# Patient Record
Sex: Female | Born: 1990 | Race: White | Hispanic: No | Marital: Single | Smoking: Never smoker
Health system: Southern US, Community
[De-identification: ages and names within clinical notes are randomized; demographics above are authoritative.]

## PROBLEM LIST (undated history)

## (undated) DIAGNOSIS — F32A Depression, unspecified: Secondary | ICD-10-CM

## (undated) DIAGNOSIS — R87629 Unspecified abnormal cytological findings in specimens from vagina: Secondary | ICD-10-CM

## (undated) DIAGNOSIS — F329 Major depressive disorder, single episode, unspecified: Secondary | ICD-10-CM

## (undated) DIAGNOSIS — F419 Anxiety disorder, unspecified: Secondary | ICD-10-CM

## (undated) DIAGNOSIS — J309 Allergic rhinitis, unspecified: Secondary | ICD-10-CM

## (undated) DIAGNOSIS — D649 Anemia, unspecified: Secondary | ICD-10-CM

## (undated) DIAGNOSIS — K589 Irritable bowel syndrome without diarrhea: Secondary | ICD-10-CM

## (undated) DIAGNOSIS — D242 Benign neoplasm of left breast: Secondary | ICD-10-CM

## (undated) DIAGNOSIS — A64 Unspecified sexually transmitted disease: Secondary | ICD-10-CM

## (undated) HISTORY — DX: Major depressive disorder, single episode, unspecified: F32.9

## (undated) HISTORY — DX: Anemia, unspecified: D64.9

## (undated) HISTORY — DX: Unspecified sexually transmitted disease: A64

## (undated) HISTORY — DX: Allergic rhinitis, unspecified: J30.9

## (undated) HISTORY — DX: Irritable bowel syndrome, unspecified: K58.9

## (undated) HISTORY — DX: Anxiety disorder, unspecified: F41.9

## (undated) HISTORY — DX: Depression, unspecified: F32.A

## (undated) HISTORY — DX: Benign neoplasm of left breast: D24.2

## (undated) HISTORY — DX: Unspecified abnormal cytological findings in specimens from vagina: R87.629

---
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
## 2011-03-02 LAB — CBC
HCT: 30.8 % — ABNORMAL LOW (ref 36.0–46.0)
Hemoglobin: 10.4 g/dL — ABNORMAL LOW (ref 12.0–15.0)
MCH: 26.4 pg (ref 26.0–34.0)
MCHC: 33.7 g/dL (ref 30.0–36.0)
MCV: 78.6 fL (ref 78.0–100.0)
Platelets: 318 10*3/uL (ref 150–400)
RBC: 3.92 MIL/uL (ref 3.87–5.11)
RDW: 15.4 % (ref 11.5–15.5)
WBC: 5.3 10*3/uL (ref 4.0–10.5)

## 2011-03-02 LAB — POCT PREGNANCY, URINE: Preg Test, Ur: NEGATIVE

## 2011-03-02 LAB — VON WILLEBRAND ANTIGEN: Von Willebrand Antigen, Plasma: 92 % (ref 50–217)

## 2011-03-02 LAB — PROTIME-INR: Prothrombin Time: 13.4 seconds (ref 11.6–15.2)

## 2011-08-19 DIAGNOSIS — A64 Unspecified sexually transmitted disease: Secondary | ICD-10-CM

## 2011-08-19 HISTORY — DX: Unspecified sexually transmitted disease: A64

## 2011-09-18 ENCOUNTER — Emergency Department (HOSPITAL_COMMUNITY)
Admission: EM | Admit: 2011-09-18 | Discharge: 2011-09-18 | Disposition: A | Discharge status: Home / Self Care | Payer: BC Managed Care – PPO | Attending: Emergency Medicine | Admitting: Emergency Medicine

## 2011-09-18 ENCOUNTER — Emergency Department (HOSPITAL_COMMUNITY): Payer: BC Managed Care – PPO

## 2011-09-18 DIAGNOSIS — N739 Female pelvic inflammatory disease, unspecified: Secondary | ICD-10-CM | POA: Insufficient documentation

## 2011-09-18 DIAGNOSIS — R109 Unspecified abdominal pain: Secondary | ICD-10-CM | POA: Insufficient documentation

## 2011-09-18 DIAGNOSIS — F411 Generalized anxiety disorder: Secondary | ICD-10-CM | POA: Insufficient documentation

## 2011-09-18 LAB — URINE MICROSCOPIC-ADD ON

## 2011-09-18 LAB — WET PREP, GENITAL: Yeast Wet Prep HPF POC: NONE SEEN

## 2011-09-18 LAB — URINALYSIS, ROUTINE W REFLEX MICROSCOPIC
Glucose, UA: NEGATIVE mg/dL
Specific Gravity, Urine: 1.017 (ref 1.005–1.030)
pH: 7 (ref 5.0–8.0)

## 2011-09-18 LAB — CBC
Hemoglobin: 12.8 g/dL (ref 12.0–15.0)
MCH: 26.8 pg (ref 26.0–34.0)
RBC: 4.77 MIL/uL (ref 3.87–5.11)
WBC: 10 10*3/uL (ref 4.0–10.5)

## 2011-09-18 LAB — DIFFERENTIAL
Basophils Absolute: 0 10*3/uL (ref 0.0–0.1)
Basophils Relative: 0 % (ref 0–1)
Monocytes Relative: 9 % (ref 3–12)
Neutro Abs: 7.3 10*3/uL (ref 1.7–7.7)
Neutrophils Relative %: 73 % (ref 43–77)

## 2011-09-18 LAB — COMPREHENSIVE METABOLIC PANEL
Albumin: 3.9 g/dL (ref 3.5–5.2)
BUN: 9 mg/dL (ref 6–23)
Creatinine, Ser: 0.62 mg/dL (ref 0.50–1.10)
GFR calc Af Amer: >90 mL/min (ref >90)
Glucose, Bld: 84 mg/dL (ref 70–99)
Total Protein: 8.3 g/dL (ref 6.0–8.3)

## 2011-09-18 LAB — LIPASE, BLOOD: Lipase: 15 U/L (ref 11–59)

## 2011-09-18 MED ORDER — IOHEXOL 300 MG/ML  SOLN
100.0000 mL | Freq: Once | INTRAMUSCULAR | Status: AC | PRN
  Administered 2011-09-18: 100 mL via INTRAVENOUS

## 2011-09-19 LAB — GC/CHLAMYDIA PROBE AMP, GENITAL
Chlamydia, DNA Probe: POSITIVE — AB
GC Probe Amp, Genital: NEGATIVE

## 2011-09-19 LAB — URINE CULTURE: Colony Count: 3000

## 2011-10-27 ENCOUNTER — Other Ambulatory Visit: Payer: Self-pay | Admitting: Obstetrics and Gynecology

## 2011-10-27 DIAGNOSIS — N63 Unspecified lump in unspecified breast: Secondary | ICD-10-CM

## 2011-10-30 ENCOUNTER — Other Ambulatory Visit: Payer: BC Managed Care – PPO

## 2011-11-03 ENCOUNTER — Ambulatory Visit
Admission: RE | Admit: 2011-11-03 | Discharge: 2011-11-03 | Disposition: A | Discharge status: Home / Self Care | Payer: BC Managed Care – PPO | Source: Ambulatory Visit | Attending: Obstetrics and Gynecology | Admitting: Obstetrics and Gynecology

## 2011-11-03 DIAGNOSIS — N63 Unspecified lump in unspecified breast: Secondary | ICD-10-CM

## 2012-12-06 IMAGING — US US ABDOMEN COMPLETE
1 series · 14 of 25 positions shown · non-contrast
Comparison: None.

CLINICAL DATA: Right upper quadrant pain.

COMPLETE ABDOMINAL ULTRASOUND

[Series 1: us abdomen complete · 0.30mm/px · 14 of 72 slices shown]
[im 1/72]
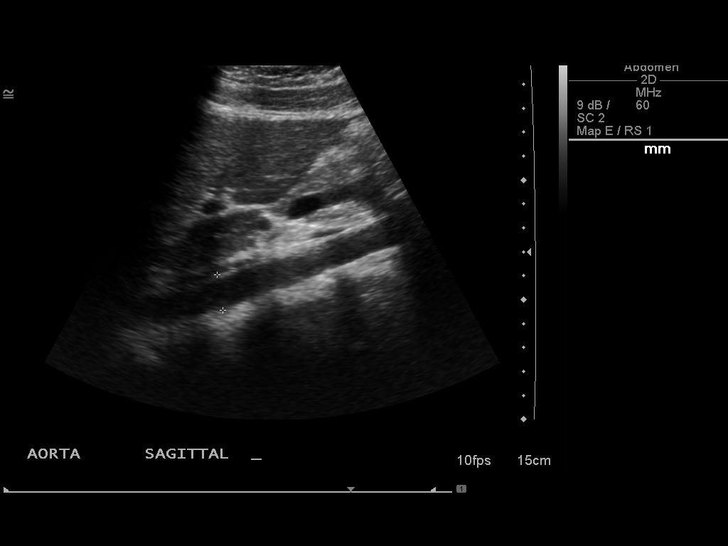
[im 6/72]
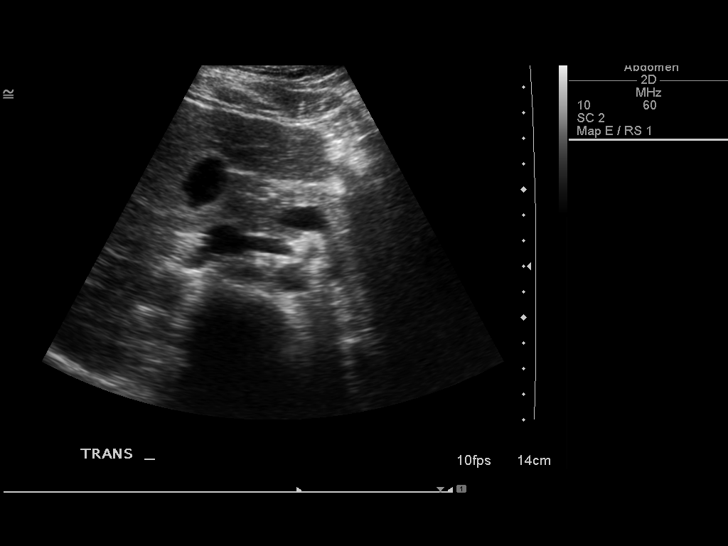
[im 12/72]
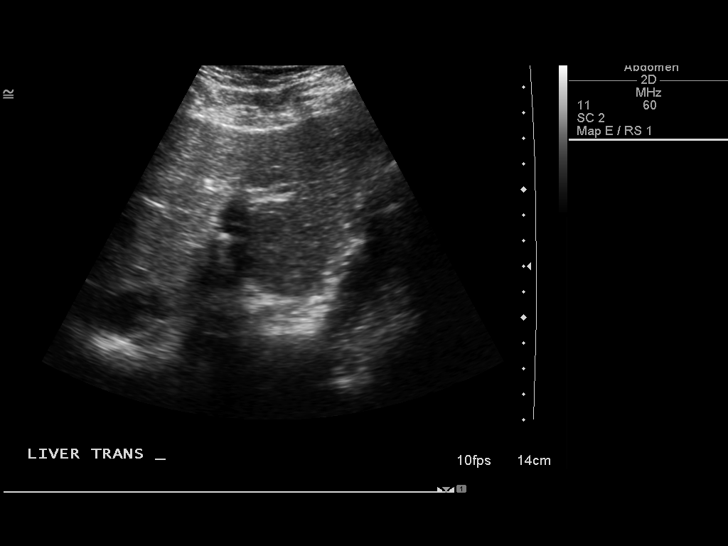
[im 18/72]
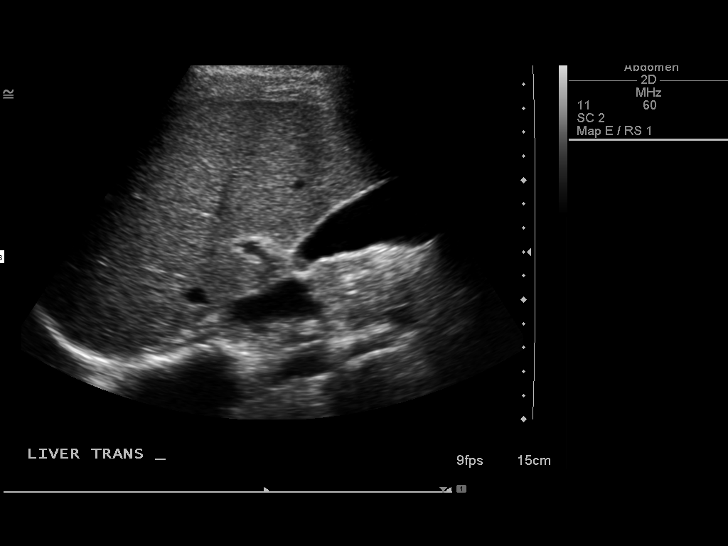
[im 24/72]
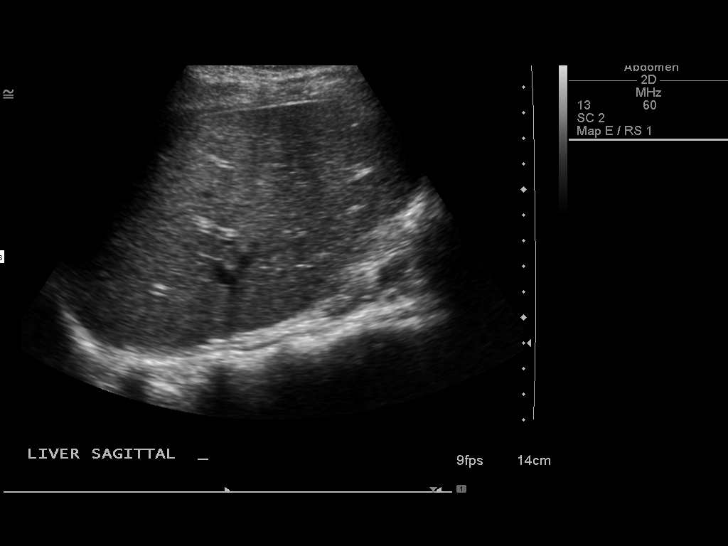
[im 27/72]
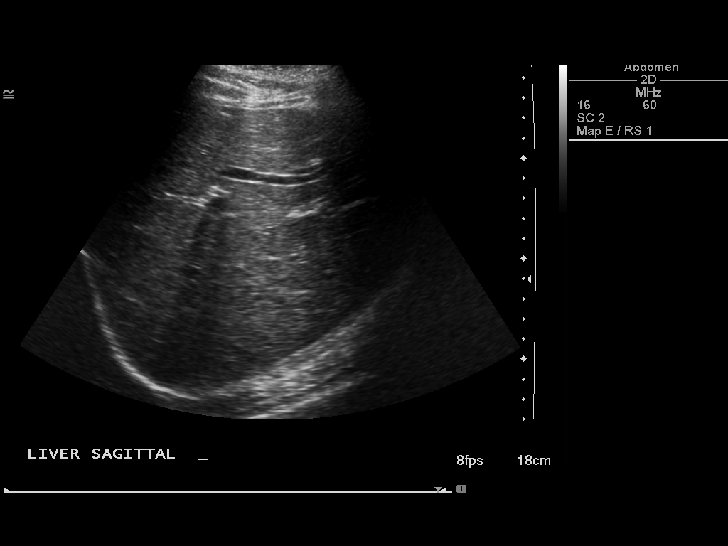
[im 33/72]
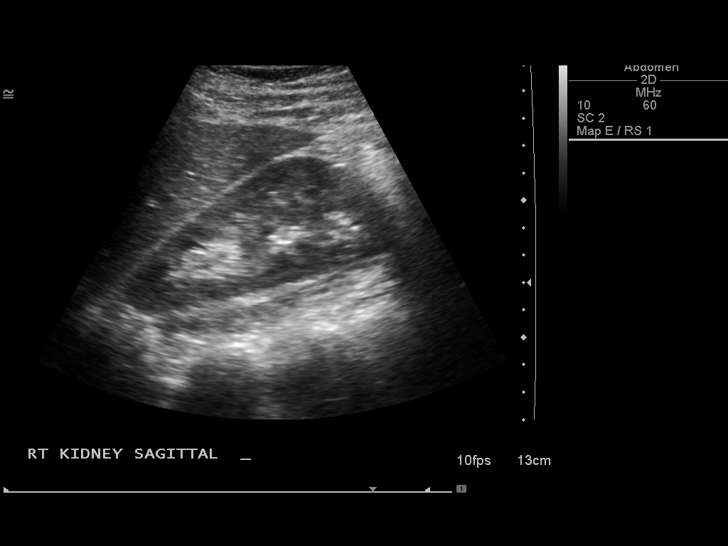
[im 39/72]
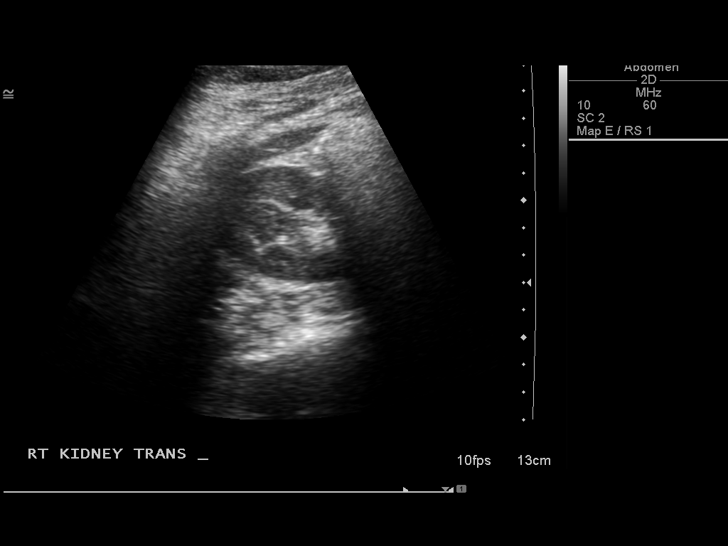
[im 45/72]
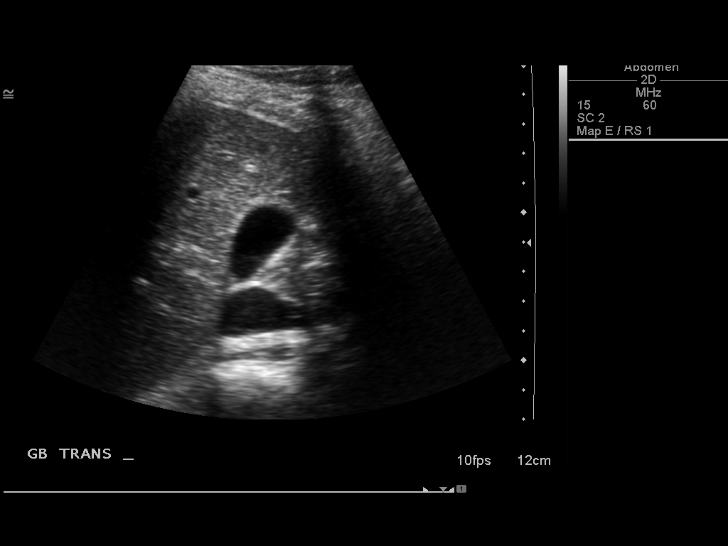
[im 48/72]
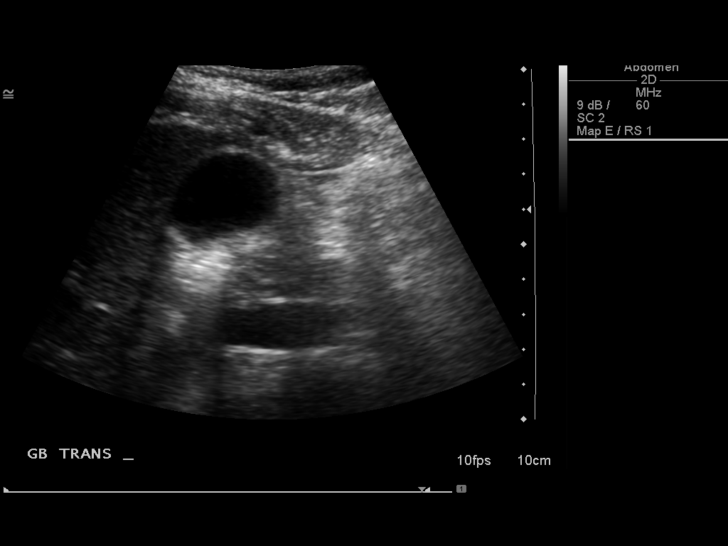
[im 54/72]
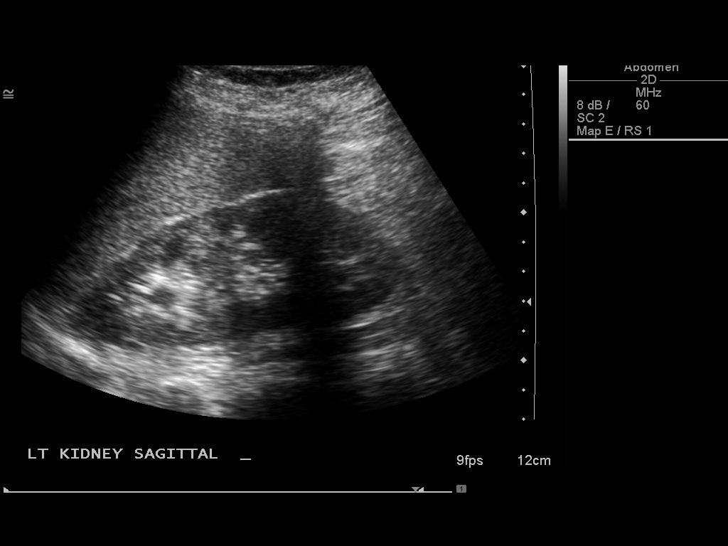
[im 60/72]
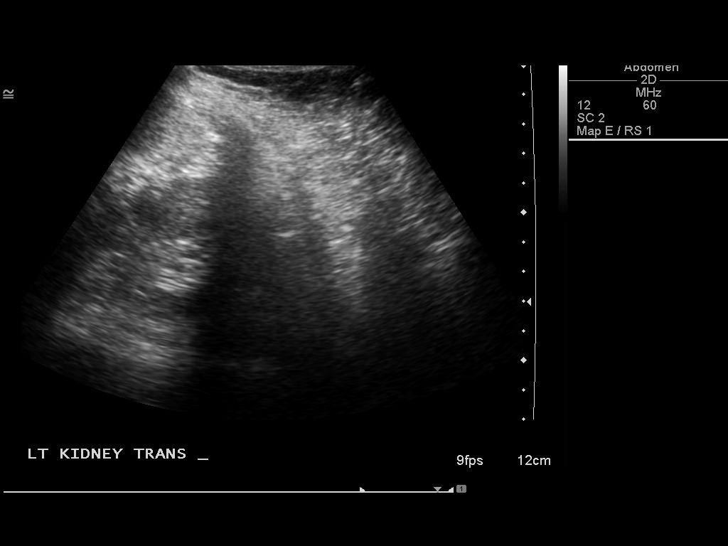
[im 66/72]
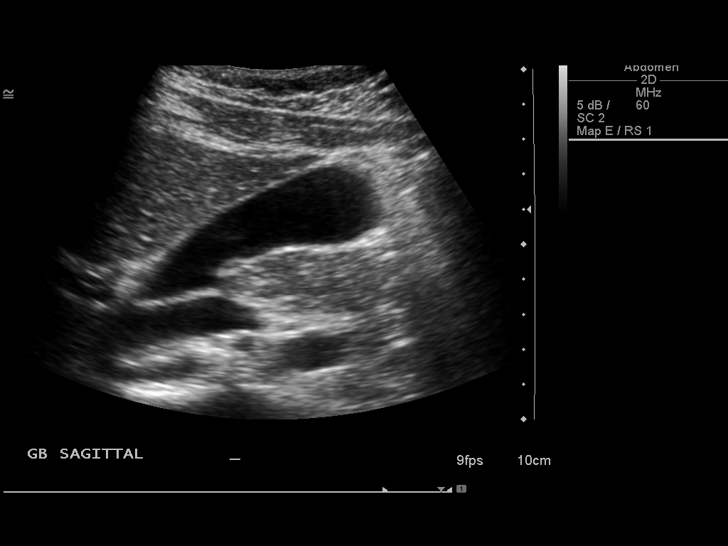
[im 72/72]
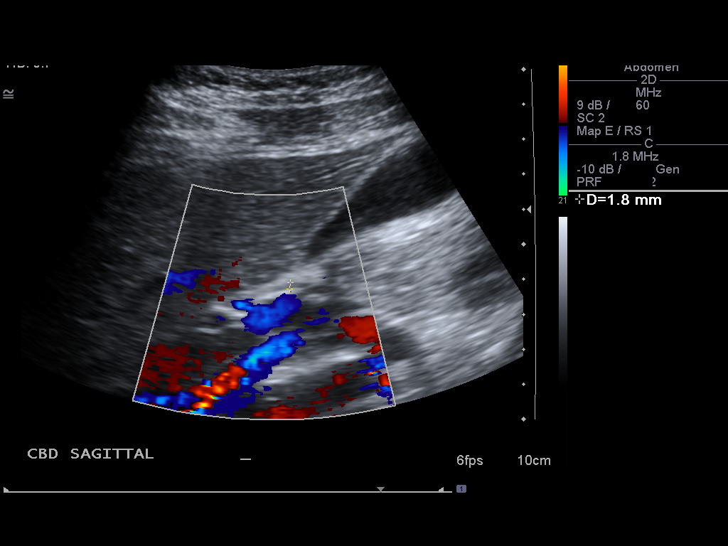

[14 of 25 positions shown; findings below may reference images not displayed]

FINDINGS: Gallbladder:  No gallstones, gallbladder wall thickening, or
pericholecystic fluid.

Common bile duct:   Within normal limits in caliber.

Liver:  No focal lesion identified.  Within normal limits in
parenchymal echogenicity.

IVC:  Appears normal.

Pancreas:  No focal abnormality seen.

Spleen:  Within normal limits in size and echotexture.

Right Kidney:   Normal in size and parenchymal echogenicity.  No
evidence of mass or hydronephrosis.

Left Kidney:  Normal in size and parenchymal echogenicity.  No
evidence of mass or hydronephrosis.

Abdominal aorta:  No aneurysm identified.
IMPRESSION: Negative abdominal ultrasound.

## 2013-03-19 ENCOUNTER — Encounter: Payer: Self-pay | Admitting: Obstetrics and Gynecology

## 2013-08-06 ENCOUNTER — Ambulatory Visit (INDEPENDENT_AMBULATORY_CARE_PROVIDER_SITE_OTHER): Payer: BC Managed Care – PPO | Admitting: Family Medicine

## 2013-08-06 VITALS — BP 114/68 | HR 72 | Temp 98.6°F | Resp 16 | Ht 68.0 in | Wt 143.0 lb

## 2013-08-06 DIAGNOSIS — Z0000 Encounter for general adult medical examination without abnormal findings: Secondary | ICD-10-CM

## 2013-08-06 DIAGNOSIS — Z23 Encounter for immunization: Secondary | ICD-10-CM

## 2013-08-06 NOTE — Progress Notes (Signed)
[facility]:  Office Visit  Chief Complaint:  Chief Complaint  Patient presents with  . Annual Exam    no pap    HPI: Colleen Diaz is a 22 y.o. female who complains of  Here for annual and needs PE for school. She needs forms filled out. She is not interested in PAP or bloodwork.  She is going to [employer]. She will be transferring from [employer] to [employer]. She sees a Veterinary surgeon for anxiety ad depression She does not due self breast exams She sees Dr Conley Simmonds for her ob/gyn needs TDap UTD , last one was 2010 She has had 2/3 Gardasil last injection was 2007  Past Medical History  Diagnosis Date  . Anxiety   . Depression    History reviewed. No pertinent past surgical history. History   Social History  . Marital Status: Single    Spouse Name: N/A    Number of Children: N/A  . Years of Education: N/A   Social History Main Topics  . Smoking status: Never Smoker   . Smokeless tobacco: None  . Alcohol Use: Yes     Comment: social  . Drug Use: None  . Sexual Activity: Yes    Partners: Male    Birth Control/ Protection: Pill   Other Topics Concern  . None   Social History Narrative  . None   Family History  Problem Relation Age of Onset  . Mental illness Mother   . Mental illness Maternal Grandmother   . Cancer Paternal Grandmother     skin cancer   No Known Allergies Prior to Admission medications   Medication Sig Start Date End Date Taking? Authorizing Provider  levonorgestrel-ethinyl estradiol (SEASONALE,INTROVALE,JOLESSA) 0.15-0.03 MG tablet Take 1 tablet by mouth daily.   Yes Historical Provider, MD     ROS: The patient denies fevers, chills, night sweats, unintentional weight loss, chest pain, palpitations, wheezing, dyspnea on exertion, nausea, vomiting, abdominal pain, dysuria, hematuria, melena, numbness, weakness, or tingling. No SI/HI  All other systems have been reviewed and were otherwise negative  with the exception of those mentioned in the HPI and as above.    PHYSICAL EXAM: Filed Vitals:   08/06/13 0807  BP: 114/68  Pulse: 72  Temp: 98.6 F (37 C)  Resp: 16   Filed Vitals:   08/06/13 0807  Height: 5\' 8"  (1.727 m)  Weight: 143 lb (64.864 kg)   Body mass index is 21.75 kg/(m^2).  General: Alert, no acute distress HEENT:  Normocephalic, atraumatic, oropharynx patent. EOMI, PERRLA, fundoscopic exam nl, TM nl Cardiovascular:  Regular rate and rhythm, no rubs murmurs or gallops.  No Carotid bruits, radial pulse intact. No pedal edema.  Respiratory: Clear to auscultation bilaterally.  No wheezes, rales, or rhonchi.  No cyanosis, no use of accessory musculature GI: No organomegaly, abdomen is soft and non-tender, positive bowel sounds.  No masses. Skin: No rashes. Neurologic: Facial musculature symmetric. Psychiatric: Patient is appropriate throughout our interaction. Lymphatic: No cervical lymphadenopathy Musculoskeletal: Gait intact. 5/5 strength, 2.2 DTRS   LABS:    EKG/XRAY:   Primary read interpreted by Dr. Conley Rolls at [facility].   ASSESSMENT/PLAN: Encounter Diagnosis  Name Primary?  . Annual physical exam Yes   Immunization requirement UTD Last dose of Gardasil given, apparently it does not matter how sar spaced out they are as long as she gets the whol series. This is per CDC guide Decline Meningitis vaccine F/u prn or in  1 year Low risk for TB so will defer PPD Gross sideeffects, risk and benefits, and alternatives of medications d/w patient. Patient is aware that all medications have potential sideeffects and we are unable to predict every sideeffect or drug-drug interaction that may occur.  Kataleah Bejar PHUONG, DO 08/06/2013 10:07 AM

## 2013-09-04 HISTORY — PX: DILATION AND CURETTAGE OF UTERUS: SHX78

## 2013-11-24 ENCOUNTER — Ambulatory Visit (INDEPENDENT_AMBULATORY_CARE_PROVIDER_SITE_OTHER): Payer: BC Managed Care – PPO | Admitting: Obstetrics and Gynecology

## 2013-11-24 ENCOUNTER — Encounter: Payer: Self-pay | Admitting: Obstetrics and Gynecology

## 2013-11-24 VITALS — BP 120/70 | HR 66 | Ht 67.5 in | Wt 140.0 lb

## 2013-11-24 DIAGNOSIS — Z01419 Encounter for gynecological examination (general) (routine) without abnormal findings: Secondary | ICD-10-CM

## 2013-11-24 DIAGNOSIS — Z113 Encounter for screening for infections with a predominantly sexual mode of transmission: Secondary | ICD-10-CM

## 2013-11-24 DIAGNOSIS — Z0000 Encounter for general adult medical examination without abnormal findings: Secondary | ICD-10-CM

## 2013-11-24 DIAGNOSIS — Z3009 Encounter for other general counseling and advice on contraception: Secondary | ICD-10-CM

## 2013-11-24 LAB — POCT URINALYSIS DIPSTICK
Ketones, UA: NEGATIVE
Protein, UA: NEGATIVE
Urobilinogen, UA: NEGATIVE
pH, UA: 5

## 2013-11-24 LAB — CBC
HCT: 35.6 % — ABNORMAL LOW (ref 36.0–46.0)
Hemoglobin: 11.8 g/dL — ABNORMAL LOW (ref 12.0–15.0)
MCV: 80 fL (ref 78.0–100.0)
RBC: 4.45 MIL/uL (ref 3.87–5.11)
RDW: 13.8 % (ref 11.5–15.5)
WBC: 6.7 10*3/uL (ref 4.0–10.5)

## 2013-11-24 MED ORDER — LEVONORGEST-ETH ESTRAD 91-DAY 0.15-0.03 MG PO TABS: 1.0000 | ORAL_TABLET | Freq: Every day | ORAL | Status: DC

## 2013-11-24 NOTE — Progress Notes (Signed)
Patient ID: Colleen Diaz, female   DOB: 18-Jan-1991, 22 y.o.   MRN: [id number] GYNECOLOGY VISIT  PCP:   Dr. Ihor Dow with [facility]  Referring provider:   HPI: 22 y.o.   Single  Caucasian  female   [id number] with Patient's last menstrual period was 11/01/2013.  A little heavier than normal.   here for   AEX. Just started Seasonale around October 20th.   Had VIP in October 2014.   Interested in [place].  Had UTI once a month.  Dysuria and frequency.   Had an STD check in September. Had a new partner since.  Pain in the right lower quadrant.  Now has some cramping.  Had some dysuria last week.   No fevers.    Hgb:     Urine:  Neg UPT:   Neg  GYNECOLOGIC HISTORY: Patient's last menstrual period was 11/01/2013. Sexually active:  yes Partner preference: female Contraception:   OCP's Menopausal hormone therapy: n;a DES exposure:   no Blood transfusions:  no  Sexually transmitted diseases:   Tx'd for Chlamydia in 2012 GYN Procedures:  TAB in 08/2013 Mammogram:  n/a               Pap:   unsure History of abnormal pap smear:  unsure   OB History   Grav Para Term Preterm Abortions TAB SAB Ect Mult Living   1    1            LIFESTYLE: Exercise:     cardio          Tobacco:     no Alcohol:       7 drinks per week Drug use:    no  OTHER HEALTH MAINTENANCE: Tetanus/TDap:   2011 Gardisil:              Completed recently Influenza:            never Zostavax:            n/a  Bone density:     n/a Colonoscopy:     n/a  Cholesterol check: never  Family History  Problem Relation Age of Onset  . Mental illness Mother   . Mental illness Maternal Grandmother   . Cancer Paternal Grandmother     skin cancer  . Thyroid disease Father   . Seizures Maternal Aunt     There are no active problems to display for this patient.  Past Medical History  Diagnosis Date  . Anxiety   . Depression   . Anemia   . STD (sexually transmitted disease) 08/2011    Tx'd for Chlamydia     Past Surgical History  Procedure Laterality Date  . Dilation and curettage of uterus  09-04-13    TAB    ALLERGIES: Review of patient's allergies indicates no known allergies.  Current Outpatient Prescriptions  Medication Sig Dispense Refill  . levonorgestrel-ethinyl estradiol (SEASONALE,INTROVALE,JOLESSA) 0.15-0.03 MG tablet Take 1 tablet by mouth daily.       No current facility-administered medications for this visit.     ROS:  Pertinent items are noted in HPI.  SOCIAL HISTORY:  Single.  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  PHYSICAL EXAMINATION:    BP 120/70  Pulse 66  Ht 5' 7.5" (1.715 m)  Wt 140 lb (63.504 kg)  BMI 21.59 kg/m2  LMP 11/01/2013   Wt Readings from Last 3 Encounters:  11/24/13 140 lb (63.504 kg)  08/06/13 143 lb (64.864 kg)  Ht Readings from Last 3 Encounters:  11/24/13 5' 7.5" (1.715 m)  08/06/13 5\' 8"  (1.727 m)    General appearance: alert, cooperative and appears stated age Head: Normocephalic, without obvious abnormality, atraumatic Neck: no adenopathy, supple, symmetrical, trachea midline and thyroid not enlarged, symmetric, no tenderness/mass/nodules Lungs: clear to auscultation bilaterally Breasts: Inspection negative, No nipple retraction or dimpling, No nipple discharge or bleeding, No axillary or supraclavicular adenopathy, Normal to palpation without dominant masses Heart: regular rate and rhythm Abdomen: soft, non-tender; no masses,  no organomegaly Extremities: extremities normal, atraumatic, no cyanosis or edema Skin: Skin color, texture, turgor normal. No rashes or lesions Lymph nodes: Cervical, supraclavicular, and axillary nodes normal. No abnormal inguinal nodes palpated Neurologic: Grossly normal  Pelvic: External genitalia:  no lesions              Urethra:  normal appearing urethra with no masses, tenderness or lesions              Bartholins and Skenes: normal                 Vagina: normal appearing vagina with  normal color and discharge, no lesions              Cervix: normal appearance              Pap and high risk HPV testing done: yes reflex testing. .            Bimanual Exam:  Uterus:  uterus is normal size, shape, consistency and nontender                                      Adnexa: normal adnexa in size, nontender and no masses                                        ASSESSMENT  Normal gynecologic exam. Recent VIP. Desire for reliable contraception. Dysuria following intercourse.  Reaction to condoms with spermacide?  PLAN  Mammogram age 22 years old.  Pap smear and high risk HPV testing - reflex STD testing, CBC. Try condoms without spermacide.  Seasonale Rx for one year. Information about Nexplanon.  Return annually or prn   An After Visit Summary was printed and given to the patient.

## 2013-11-24 NOTE — Patient Instructions (Addendum)
Etonogestrel implant What is this medicine? ETONOGESTREL (et oh noe JES trel) is a contraceptive (birth control) device. It is used to prevent pregnancy. It can be used for up to 3 years. This medicine may be used for other purposes; ask your health care provider or pharmacist if you have questions. COMMON BRAND NAME(S): Implanon, Nexplanon  What should I tell my health care provider before I take this medicine? They need to know if you have any of these conditions: -abnormal vaginal bleeding -blood vessel disease or blood clots -cancer of the breast, cervix, or liver -depression -diabetes -gallbladder disease -headaches -heart disease or recent heart attack -high blood pressure -high cholesterol -kidney disease -liver disease -renal disease -seizures -tobacco smoker -an unusual or allergic reaction to etonogestrel, other hormones, anesthetics or antiseptics, medicines, foods, dyes, or preservatives -pregnant or trying to get pregnant -breast-feeding How should I use this medicine? This device is inserted just under the skin on the inner side of your upper arm by a health care professional. Talk to your pediatrician regarding the use of this medicine in children. Special care may be needed. Overdosage: If you think you've taken too much of this medicine contact a poison control center or emergency room at once. Overdosage: If you think you have taken too much of this medicine contact a poison control center or emergency room at once. NOTE: This medicine is only for you. Do not share this medicine with others. What if I miss a dose? This does not apply. What may interact with this medicine? Do not take this medicine with any of the following medications: -amprenavir -bosentan -fosamprenavir This medicine may also interact with the following medications: -barbiturate medicines for inducing sleep or treating seizures -certain medicines for fungal infections like ketoconazole and  itraconazole -griseofulvin -medicines to treat seizures like carbamazepine, felbamate, oxcarbazepine, phenytoin, topiramate -modafinil -phenylbutazone -rifampin -some medicines to treat HIV infection like atazanavir, indinavir, lopinavir, nelfinavir, tipranavir, ritonavir -St. John's wort This list may not describe all possible interactions. Give your health care provider a list of all the medicines, herbs, non-prescription drugs, or dietary supplements you use. Also tell them if you smoke, drink alcohol, or use illegal drugs. Some items may interact with your medicine. What should I watch for while using this medicine? This product does not protect you against HIV infection (AIDS) or other sexually transmitted diseases. You should be able to feel the implant by pressing your fingertips over the skin where it was inserted. Tell your doctor if you cannot feel the implant. What side effects may I notice from receiving this medicine? Side effects that you should report to your doctor or health care professional as soon as possible: -allergic reactions like skin rash, itching or hives, swelling of the face, lips, or tongue -breast lumps -changes in vision -confusion, trouble speaking or understanding -dark urine -depressed mood -general ill feeling or flu-like symptoms -light-colored stools -loss of appetite, nausea -right upper belly pain -severe headaches -severe pain, swelling, or tenderness in the abdomen -shortness of breath, chest pain, swelling in a leg -signs of pregnancy -sudden numbness or weakness of the face, arm or leg -trouble walking, dizziness, loss of balance or coordination -unusual vaginal bleeding, discharge -unusually weak or tired -yellowing of the eyes or skin Side effects that usually do not require medical attention (Report these to your doctor or health care professional if they continue or are bothersome.): -acne -breast pain -changes in  weight -cough -fever or chills -headache -irregular menstrual bleeding -itching, burning,   and vaginal discharge -pain or difficulty passing urine -sore throat This list may not describe all possible side effects. Call your doctor for medical advice about side effects. You may report side effects to FDA at 1-800-FDA-1088. Where should I keep my medicine? This drug is given in a hospital or clinic and will not be stored at home. NOTE: This sheet is a summary. It may not cover all possible information. If you have questions about this medicine, talk to your doctor, pharmacist, or health care provider.  2014, Elsevier/Gold Standard. (2012-06-10 15:37:45)  EXERCISE AND DIET:  We recommended that you start or continue a regular exercise program for good health. Regular exercise means any activity that makes your heart beat faster and makes you sweat.  We recommend exercising at least 30 minutes per day at least 3 days a week, preferably 4 or 5.  We also recommend a diet low in fat and sugar.  Inactivity, poor dietary choices and obesity can cause diabetes, heart attack, stroke, and kidney damage, among others.    ALCOHOL AND SMOKING:  Women should limit their alcohol intake to no more than 7 drinks/beers/glasses of wine (combined, not each!) per week. Moderation of alcohol intake to this level decreases your risk of breast cancer and liver damage. And of course, no recreational drugs are part of a healthy lifestyle.  And absolutely no smoking or even second hand smoke. Most people know smoking can cause heart and lung diseases, but did you know it also contributes to weakening of your bones? Aging of your skin?  Yellowing of your teeth and nails?  CALCIUM AND VITAMIN D:  Adequate intake of calcium and Vitamin D are recommended.  The recommendations for exact amounts of these supplements seem to change often, but generally speaking 600 mg of calcium (either carbonate or citrate) and 800 units of Vitamin  D per day seems prudent. Certain women may benefit from higher intake of Vitamin D.  If you are among these women, your doctor will have told you during your visit.    PAP SMEARS:  Pap smears, to check for cervical cancer or precancers,  have traditionally been done yearly, although recent scientific advances have shown that most women can have pap smears less often.  However, every woman still should have a physical exam from her gynecologist every year. It will include a breast check, inspection of the vulva and vagina to check for abnormal growths or skin changes, a visual exam of the cervix, and then an exam to evaluate the size and shape of the uterus and ovaries.  And after 21 years of age, a rectal exam is indicated to check for rectal cancers. We will also provide age appropriate advice regarding health maintenance, like when you should have certain vaccines, screening for sexually transmitted diseases, bone density testing, colonoscopy, mammograms, etc.   MAMMOGRAMS:  All women over 51 years old should have a yearly mammogram. Many facilities now offer a "3D" mammogram, which may cost around $50 extra out of pocket. If possible,  we recommend you accept the option to have the 3D mammogram performed.  It both reduces the number of women who will be called back for extra views which then turn out to be normal, and it is better than the routine mammogram at detecting truly abnormal areas.    COLONOSCOPY:  Colonoscopy to screen for colon cancer is recommended for all women at age 81.  We know, you hate the idea of the prep.  We  agree, BUT, having colon cancer and not knowing it is worse!!  Colon cancer so often starts as a polyp that can be seen and removed at colonscopy, which can quite literally save your life!  And if your first colonoscopy is normal and you have no family history of colon cancer, most women don't have to have it again for 10 years.  Once every ten years, you can do something that may  end up saving your life, right?  We will be happy to help you get it scheduled when you are ready.  Be sure to check your insurance coverage so you understand how much it will cost.  It may be covered as a preventative service at no cost, but you should check your particular policy.

## 2013-11-25 LAB — STD PANEL: Hepatitis B Surface Ag: NEGATIVE

## 2013-11-25 LAB — GC/CHLAMYDIA PROBE AMP, URINE: GC Probe Amp, Urine: NEGATIVE

## 2013-11-25 LAB — HEPATITIS C ANTIBODY: HCV Ab: NEGATIVE

## 2013-11-27 ENCOUNTER — Telehealth: Payer: Self-pay | Admitting: Obstetrics and Gynecology

## 2013-11-27 LAB — IPS PAP TEST WITH REFLEX TO HPV

## 2013-11-27 NOTE — Telephone Encounter (Signed)
Spoke with patient in regards to her benefits for a Nexplanon insertion. Patient is agreeable and will call with her cycle. She is expecting her cycle within the next week.

## 2013-12-10 ENCOUNTER — Encounter: Payer: BC Managed Care – PPO | Admitting: Obstetrics and Gynecology

## 2013-12-16 ENCOUNTER — Telehealth: Payer: Self-pay | Admitting: Obstetrics and Gynecology

## 2013-12-16 NOTE — Telephone Encounter (Signed)
Patient to call w/cycle/ pr$0//ssf

## 2013-12-22 ENCOUNTER — Encounter (HOSPITAL_COMMUNITY): Payer: Self-pay | Admitting: Emergency Medicine

## 2013-12-22 ENCOUNTER — Emergency Department (HOSPITAL_COMMUNITY)
Admission: EM | Admit: 2013-12-22 | Discharge: 2013-12-23 | Disposition: A | Discharge status: Home / Self Care | Payer: BC Managed Care – PPO | Attending: Emergency Medicine | Admitting: Emergency Medicine

## 2013-12-22 DIAGNOSIS — T7421XA Adult sexual abuse, confirmed, initial encounter: Secondary | ICD-10-CM | POA: Insufficient documentation

## 2013-12-22 DIAGNOSIS — Z8619 Personal history of other infectious and parasitic diseases: Secondary | ICD-10-CM | POA: Insufficient documentation

## 2013-12-22 DIAGNOSIS — Z8659 Personal history of other mental and behavioral disorders: Secondary | ICD-10-CM | POA: Insufficient documentation

## 2013-12-22 DIAGNOSIS — Z3202 Encounter for pregnancy test, result negative: Secondary | ICD-10-CM | POA: Insufficient documentation

## 2013-12-22 DIAGNOSIS — IMO0002 Reserved for concepts with insufficient information to code with codable children: Secondary | ICD-10-CM

## 2013-12-22 DIAGNOSIS — Z202 Contact with and (suspected) exposure to infections with a predominantly sexual mode of transmission: Secondary | ICD-10-CM

## 2013-12-22 DIAGNOSIS — Z862 Personal history of diseases of the blood and blood-forming organs and certain disorders involving the immune mechanism: Secondary | ICD-10-CM | POA: Insufficient documentation

## 2013-12-22 LAB — URINALYSIS, ROUTINE W REFLEX MICROSCOPIC
Bilirubin Urine: NEGATIVE
Glucose, UA: NEGATIVE mg/dL
Hgb urine dipstick: NEGATIVE
Ketones, ur: 40 mg/dL — AB
LEUKOCYTES UA: NEGATIVE
NITRITE: NEGATIVE
PROTEIN: NEGATIVE mg/dL
Specific Gravity, Urine: 1.025 (ref 1.005–1.030)
UROBILINOGEN UA: 0.2 mg/dL (ref 0.0–1.0)
pH: 6 (ref 5.0–8.0)
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
## 2013-12-22 NOTE — Telephone Encounter (Signed)
Patient says she need to come in for some "test". Patient was unable to give details. Patient is asking to be seen A.S.A.P.
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
## 2013-12-22 NOTE — Telephone Encounter (Signed)
Spoke with patient. She states she is unable to talk right now but will call back in about 5 minutes.

## 2013-12-23 LAB — GC/CHLAMYDIA PROBE AMP
CT Probe RNA: NEGATIVE
GC Probe RNA: NEGATIVE

## 2013-12-23 LAB — PREGNANCY, URINE: PREG TEST UR: NEGATIVE

## 2013-12-23 LAB — WET PREP, GENITAL
Trich, Wet Prep: NONE SEEN
Yeast Wet Prep HPF POC: NONE SEEN

## 2013-12-23 MED ORDER — METRONIDAZOLE 500 MG PO TABS
2000.0000 mg | ORAL_TABLET | Freq: Once | ORAL | Status: AC
  Administered 2013-12-23: 2000 mg via ORAL
  Filled 2013-12-23: qty 4

## 2013-12-23 MED ORDER — AZITHROMYCIN 1 G PO PACK
1.0000 g | PACK | Freq: Once | ORAL | Status: DC
  Filled 2013-12-23: qty 1

## 2013-12-23 MED ORDER — AZITHROMYCIN 250 MG PO TABS
1000.0000 mg | ORAL_TABLET | Freq: Once | ORAL | Status: AC
  Administered 2013-12-23: 1000 mg via ORAL
  Filled 2013-12-23: qty 4

## 2013-12-23 MED ORDER — ONDANSETRON 8 MG PO TBDP
8.0000 mg | ORAL_TABLET | Freq: Once | ORAL | Status: AC
  Administered 2013-12-23: 8 mg via ORAL

## 2013-12-23 MED ORDER — CEFTRIAXONE SODIUM 250 MG IJ SOLR
250.0000 mg | Freq: Once | INTRAMUSCULAR | Status: AC
  Administered 2013-12-23: 250 mg via INTRAMUSCULAR
  Filled 2013-12-23: qty 250

## 2013-12-23 MED ORDER — LIDOCAINE HCL 1 % IJ SOLN
INTRAMUSCULAR | Status: AC
  Administered 2013-12-23: 0.9 mL
  Filled 2013-12-23: qty 20

## 2013-12-23 MED ORDER — ONDANSETRON 8 MG PO TBDP
ORAL_TABLET | ORAL | Status: AC
  Filled 2013-12-23: qty 1
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
## 2013-12-23 NOTE — ED Provider Notes (Signed)
Medical screening examination/treatment/procedure(s) were performed by non-physician practitioner and as supervising physician I was immediately available for consultation/collaboration.  EKG Interpretation   None        Tinsley Lomas K Zahi Plaskett-Rasch, MD 12/23/13 [phone]
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
## 2013-12-23 NOTE — ED Notes (Signed)
Patient c/o nausea, patient given gingerale and saltines.
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
## 2013-12-23 NOTE — ED Notes (Signed)
Patient continues to c/o nausea

## 2013-12-24 ENCOUNTER — Ambulatory Visit (INDEPENDENT_AMBULATORY_CARE_PROVIDER_SITE_OTHER): Payer: BC Managed Care – PPO | Admitting: Obstetrics and Gynecology

## 2013-12-24 ENCOUNTER — Encounter: Payer: Self-pay | Admitting: Obstetrics and Gynecology

## 2013-12-24 VITALS — BP 100/60 | HR 60 | Ht 67.5 in | Wt 143.5 lb

## 2013-12-24 DIAGNOSIS — Z5189 Encounter for other specified aftercare: Secondary | ICD-10-CM

## 2013-12-24 DIAGNOSIS — T7421XD Adult sexual abuse, confirmed, subsequent encounter: Secondary | ICD-10-CM

## 2013-12-24 NOTE — Patient Instructions (Signed)
Please call the therapist to set up an appointment.
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
## 2013-12-25 ENCOUNTER — Telehealth: Payer: Self-pay | Admitting: Obstetrics and Gynecology

## 2013-12-25 NOTE — Telephone Encounter (Signed)
VM has number confirmation, LMTCB.

## 2013-12-25 NOTE — Telephone Encounter (Signed)
Patient called and left a message at 1:25 wanting a nurse to call her said that it was important but didn't say what it was about.

## 2013-12-26 NOTE — Telephone Encounter (Signed)
Pt returning call. Would like call after 1:30 if possible.
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
## 2013-12-29 ENCOUNTER — Ambulatory Visit: Payer: BC Managed Care – PPO | Admitting: Obstetrics and Gynecology

## 2013-12-29 ENCOUNTER — Other Ambulatory Visit: Payer: Self-pay | Admitting: Obstetrics and Gynecology

## 2013-12-29 DIAGNOSIS — Z3009 Encounter for other general counseling and advice on contraception: Secondary | ICD-10-CM

## 2013-12-29 NOTE — Telephone Encounter (Signed)
Pt cx appt for today because she started her cycle. She wants to schedule an appointment Nexplanon.

## 2013-12-29 NOTE — Telephone Encounter (Signed)
Please let the patient know that I will have a Nexplanon precerted for her.  Please mail a brochure to her as well.  Thanks.

## 2013-12-29 NOTE — Telephone Encounter (Signed)
Spoke with patient. She started cycle 12/27/13.  Would like to proceed with nexplanon. She has pamphlets from prior visits with Dr. Quincy Simmonds and states she has reviewed them.  Patient precerted, scheduled for 12/31/13 with Dr. Quincy Simmonds for nexplanon insertion.

## 2013-12-29 NOTE — Telephone Encounter (Signed)
Colleen Diaz can you let me know when you have precert completed?

## 2013-12-29 NOTE — Telephone Encounter (Signed)
Pre cert complete//pr $1//VQM

## 2013-12-29 NOTE — Telephone Encounter (Signed)
Order was placed for a Nexplanon.

## 2013-12-31 ENCOUNTER — Ambulatory Visit: Payer: BC Managed Care – PPO | Admitting: Obstetrics and Gynecology

## 2013-12-31 ENCOUNTER — Telehealth: Payer: Self-pay | Admitting: Obstetrics and Gynecology

## 2013-12-31 NOTE — Telephone Encounter (Signed)
Return call to patient to resched Nexplanon. LMTCB. Ask for [facility] nurse.

## 2013-12-31 NOTE — Telephone Encounter (Signed)
Patient needs to reschedule her Nexplanon insertion appointment today due to weather.

## 2014-01-08 ENCOUNTER — Telehealth: Payer: Self-pay | Admitting: Obstetrics and Gynecology

## 2014-01-08 NOTE — Telephone Encounter (Signed)
Patient to call w/cycle to reschedule nexplanon/ advised to call within first 5 days of cycle//ssf

## 2014-02-05 ENCOUNTER — Ambulatory Visit: Payer: BC Managed Care – PPO | Admitting: Obstetrics and Gynecology

## 2014-02-05 ENCOUNTER — Telehealth: Payer: Self-pay | Admitting: Obstetrics and Gynecology

## 2014-02-05 NOTE — Telephone Encounter (Signed)
Patient dnka "6 week recheck" today with Dr. Quincy Simmonds. I called the patient and left a message to call us back to reschedule.

## 2014-02-05 NOTE — Telephone Encounter (Signed)
Thank you.  I will close the encounter. 

## 2014-03-17 ENCOUNTER — Telehealth: Payer: Self-pay | Admitting: Obstetrics and Gynecology

## 2014-03-17 NOTE — Telephone Encounter (Signed)
Spoke with patient. Scheduled procedure

## 2014-03-17 NOTE — Telephone Encounter (Signed)
Patient has started her cycle and is ready to schedule her Nexplanon insertion.

## 2014-03-18 ENCOUNTER — Ambulatory Visit (INDEPENDENT_AMBULATORY_CARE_PROVIDER_SITE_OTHER): Payer: BC Managed Care – PPO | Admitting: Obstetrics and Gynecology

## 2014-03-18 ENCOUNTER — Encounter: Payer: Self-pay | Admitting: Obstetrics and Gynecology

## 2014-03-18 DIAGNOSIS — Z113 Encounter for screening for infections with a predominantly sexual mode of transmission: Secondary | ICD-10-CM

## 2014-03-18 DIAGNOSIS — Z30017 Encounter for initial prescription of implantable subdermal contraceptive: Secondary | ICD-10-CM

## 2014-03-18 DIAGNOSIS — Z3009 Encounter for other general counseling and advice on contraception: Secondary | ICD-10-CM

## 2014-03-18 NOTE — Patient Instructions (Signed)
Etonogestrel implant What is this medicine? ETONOGESTREL (et oh noe JES trel) is a contraceptive (birth control) device. It is used to prevent pregnancy. It can be used for up to 3 years. This medicine may be used for other purposes; ask your health care provider or pharmacist if you have questions. COMMON BRAND NAME(S): Implanon, Nexplanon  What should I tell my health care provider before I take this medicine? They need to know if you have any of these conditions: -abnormal vaginal bleeding -blood vessel disease or blood clots -cancer of the breast, cervix, or liver -depression -diabetes -gallbladder disease -headaches -heart disease or recent heart attack -high blood pressure -high cholesterol -kidney disease -liver disease -renal disease -seizures -tobacco smoker -an unusual or allergic reaction to etonogestrel, other hormones, anesthetics or antiseptics, medicines, foods, dyes, or preservatives -pregnant or trying to get pregnant -breast-feeding How should I use this medicine? This device is inserted just under the skin on the inner side of your upper arm by a health care professional. Talk to your pediatrician regarding the use of this medicine in children. Special care may be needed. Overdosage: If you think you've taken too much of this medicine contact a poison control center or emergency room at once. Overdosage: If you think you have taken too much of this medicine contact a poison control center or emergency room at once. NOTE: This medicine is only for you. Do not share this medicine with others. What if I miss a dose? This does not apply. What may interact with this medicine? Do not take this medicine with any of the following medications: -amprenavir -bosentan -fosamprenavir This medicine may also interact with the following medications: -barbiturate medicines for inducing sleep or treating seizures -certain medicines for fungal infections like ketoconazole and  itraconazole -griseofulvin -medicines to treat seizures like carbamazepine, felbamate, oxcarbazepine, phenytoin, topiramate -modafinil -phenylbutazone -rifampin -some medicines to treat HIV infection like atazanavir, indinavir, lopinavir, nelfinavir, tipranavir, ritonavir -St. John's wort This list may not describe all possible interactions. Give your health care provider a list of all the medicines, herbs, non-prescription drugs, or dietary supplements you use. Also tell them if you smoke, drink alcohol, or use illegal drugs. Some items may interact with your medicine. What should I watch for while using this medicine? This product does not protect you against HIV infection (AIDS) or other sexually transmitted diseases. You should be able to feel the implant by pressing your fingertips over the skin where it was inserted. Tell your doctor if you cannot feel the implant. What side effects may I notice from receiving this medicine? Side effects that you should report to your doctor or health care professional as soon as possible: -allergic reactions like skin rash, itching or hives, swelling of the face, lips, or tongue -breast lumps -changes in vision -confusion, trouble speaking or understanding -dark urine -depressed mood -general ill feeling or flu-like symptoms -light-colored stools -loss of appetite, nausea -right upper belly pain -severe headaches -severe pain, swelling, or tenderness in the abdomen -shortness of breath, chest pain, swelling in a leg -signs of pregnancy -sudden numbness or weakness of the face, arm or leg -trouble walking, dizziness, loss of balance or coordination -unusual vaginal bleeding, discharge -unusually weak or tired -yellowing of the eyes or skin Side effects that usually do not require medical attention (Report these to your doctor or health care professional if they continue or are bothersome.): -acne -breast pain -changes in  weight -cough -fever or chills -headache -irregular menstrual bleeding -itching, burning,   and vaginal discharge -pain or difficulty passing urine -sore throat This list may not describe all possible side effects. Call your doctor for medical advice about side effects. You may report side effects to FDA at 1-800-FDA-1088. Where should I keep my medicine? This drug is given in a hospital or clinic and will not be stored at home. NOTE: This sheet is a summary. It may not cover all possible information. If you have questions about this medicine, talk to your doctor, pharmacist, or health care provider.  2014, [employer]vier/[employer] Standard. (2012-06-10 15:37:45)  Call for fever, redness of your arm, heavy bleeding or bruising in your arm, or pain in your arm.
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
## 2014-03-19 LAB — STD PANEL
HEP B S AG: NEGATIVE
HIV: NONREACTIVE

## 2014-03-19 LAB — HEPATITIS C ANTIBODY: HCV Ab: NEGATIVE

## 2014-03-24 ENCOUNTER — Other Ambulatory Visit: Payer: BC Managed Care – PPO

## 2014-04-22 ENCOUNTER — Encounter (HOSPITAL_COMMUNITY): Payer: Self-pay | Admitting: Emergency Medicine

## 2014-04-22 ENCOUNTER — Telehealth: Payer: Self-pay | Admitting: *Deleted

## 2014-04-22 ENCOUNTER — Telehealth: Payer: Self-pay | Admitting: Obstetrics and Gynecology

## 2014-04-22 ENCOUNTER — Emergency Department (HOSPITAL_COMMUNITY)
Admission: EM | Admit: 2014-04-22 | Discharge: 2014-04-22 | Disposition: A | Discharge status: Home / Self Care | Payer: BC Managed Care – PPO | Attending: Emergency Medicine | Admitting: Emergency Medicine

## 2014-04-22 DIAGNOSIS — Z8659 Personal history of other mental and behavioral disorders: Secondary | ICD-10-CM | POA: Insufficient documentation

## 2014-04-22 DIAGNOSIS — R0789 Other chest pain: Secondary | ICD-10-CM | POA: Insufficient documentation

## 2014-04-22 DIAGNOSIS — Z8619 Personal history of other infectious and parasitic diseases: Secondary | ICD-10-CM | POA: Insufficient documentation

## 2014-04-22 DIAGNOSIS — R000 Tachycardia, unspecified: Secondary | ICD-10-CM | POA: Insufficient documentation

## 2014-04-22 DIAGNOSIS — Z79899 Other long term (current) drug therapy: Secondary | ICD-10-CM | POA: Insufficient documentation

## 2014-04-22 DIAGNOSIS — F411 Generalized anxiety disorder: Secondary | ICD-10-CM | POA: Insufficient documentation

## 2014-04-22 DIAGNOSIS — Z3202 Encounter for pregnancy test, result negative: Secondary | ICD-10-CM | POA: Insufficient documentation

## 2014-04-22 DIAGNOSIS — Z862 Personal history of diseases of the blood and blood-forming organs and certain disorders involving the immune mechanism: Secondary | ICD-10-CM | POA: Insufficient documentation

## 2014-04-22 DIAGNOSIS — R002 Palpitations: Secondary | ICD-10-CM | POA: Insufficient documentation

## 2014-04-22 LAB — BASIC METABOLIC PANEL
BUN: 12 mg/dL (ref 6–23)
CO2: 24 mEq/L (ref 19–32)
CREATININE: 0.76 mg/dL (ref 0.50–1.10)
Calcium: 9.5 mg/dL (ref 8.4–10.5)
Chloride: 103 mEq/L (ref 96–112)
GFR calc non Af Amer: >90 mL/min (ref >90)
Glucose, Bld: 82 mg/dL (ref 70–99)
Potassium: 4.2 mEq/L (ref 3.7–5.3)
Sodium: 141 mEq/L (ref 137–147)

## 2014-04-22 LAB — CBC
HCT: 38.3 % (ref 36.0–46.0)
Hemoglobin: 12.4 g/dL (ref 12.0–15.0)
MCH: 26.7 pg (ref 26.0–34.0)
MCHC: 32.4 g/dL (ref 30.0–36.0)
MCV: 82.4 fL (ref 78.0–100.0)
PLATELETS: 306 10*3/uL (ref 150–400)
RBC: 4.65 MIL/uL (ref 3.87–5.11)
RDW: 13.9 % (ref 11.5–15.5)
WBC: 7.4 10*3/uL (ref 4.0–10.5)

## 2014-04-22 LAB — URINALYSIS, ROUTINE W REFLEX MICROSCOPIC
Bilirubin Urine: NEGATIVE
GLUCOSE, UA: NEGATIVE mg/dL
HGB URINE DIPSTICK: NEGATIVE
Ketones, ur: NEGATIVE mg/dL
Leukocytes, UA: NEGATIVE
Nitrite: NEGATIVE
PH: 6.5 (ref 5.0–8.0)
Protein, ur: NEGATIVE mg/dL
Specific Gravity, Urine: 1.006 (ref 1.005–1.030)
Urobilinogen, UA: 0.2 mg/dL (ref 0.0–1.0)

## 2014-04-22 LAB — PREGNANCY, URINE: Preg Test, Ur: NEGATIVE

## 2014-04-22 MED ORDER — SODIUM CHLORIDE 0.9 % IV BOLUS (SEPSIS)
1000.0000 mL | Freq: Once | INTRAVENOUS | Status: AC
  Administered 2014-04-22: 1000 mL via INTRAVENOUS

## 2014-04-22 MED ORDER — LORAZEPAM 2 MG/ML IJ SOLN
1.0000 mg | Freq: Once | INTRAMUSCULAR | Status: AC
  Administered 2014-04-22: 1 mg via INTRAVENOUS
  Filled 2014-04-22: qty 1

## 2014-04-22 MED ORDER — ALPRAZOLAM 0.5 MG PO TABS: 0.5000 mg | ORAL_TABLET | Freq: Three times a day (TID) | ORAL | Status: DC | PRN

## 2014-04-22 NOTE — ED Notes (Signed)
Pt states that she has been having episodes of tachycardia that comes and goes.  States that she sometimes has chest pain.  SOB now.  Pt states hx of anxiety.  Had nexplanon put in a month ago and stated that she thinks this may have something to do with it.  Denies NVD.

## 2014-04-22 NOTE — ED Notes (Signed)
Bed: WA14 Expected date:  Expected time:  Means of arrival:  Comments: Hold for triage  

## 2014-04-22 NOTE — Telephone Encounter (Signed)
Patient called at 63 from ED at [facility] requesting to have Nexplanon removed tonight.  Patient is still in ED. Advised that I cant not get it removed today since it is this late and she is still in ED.  She can ask MD at ED but I do not think they will remove. She is scheduled for recheck tomorrow.  States she wants it removed because it is causing her panic attacks and that ED has not found any cardiac issues.  SPatient asked if this is common with Nexplanon?  Advised that although I am not familiar with this being related to panic attacks, only way to know is to remove it and see if symptoms resolve.  Has to be aware that may have it removed and symptoms do not improve.  Support given that at least not a cardiac issue and that she can review concerns with Dr Quincy Simmonds tomorrow.  Removal is not a difficult thing to do and MD can remove tomorrow.    To Sabrina for precert.  To Dr Charlies Constable on call.  Routing to Dr Quincy Simmonds for final review. Patient agreeable to disposition. Will close encounter

## 2014-04-22 NOTE — Telephone Encounter (Signed)
Patient is having pain and rapid heart rate after having Nexplanon. Patient has Neplanon inserted 03/18/2014.

## 2014-04-22 NOTE — ED Provider Notes (Signed)
EKG Interpretation  Date/Time:  Wednesday Apr 22 2014 15:30:19 EDT Ventricular Rate:  112 PR Interval:  126 QRS Duration: 85 QT Interval:  322 QTC Calculation: 439 R Axis:   89 Text Interpretation:  Sinus tachycardia No old tracing to compare Confirmed by Owens Hara  MD, Avis Mcmahill ([id number]) on 04/22/2014 11:11:41 PM       ECG interpretation 1653pm  Date: 04/22/2014  Rate: 141  Rhythm: sinus tachycardia  QRS Axis: normal  Intervals: normal  ST/T Wave abnormalities: normal  Conduction Disutrbances: none  Narrative Interpretation:   Old EKG Reviewed: increased [place], MD 04/22/14 2314

## 2014-04-22 NOTE — Discharge Instructions (Signed)
Please follow up with your primary care physician in 1-2 days. If you do not have one please call the [facility] number listed above. Please read all discharge instructions and return precautions.   Nonspecific Tachycardia Tachycardia is a faster than normal heartbeat (more than 100 beats per minute). In adults, the heart normally beats between 60 and 100 times a minute. A fast heartbeat may be a normal response to exercise or stress. It does not necessarily mean that something is wrong. However, sometimes when your heart beats too fast it may not be able to pump enough blood to the rest of your body. This can result in chest pain, shortness of breath, dizziness, and even fainting. Nonspecific tachycardia means that the specific cause or pattern of your tachycardia is unknown. CAUSES  Tachycardia may be harmless or it may be due to a more serious underlying cause. Possible causes of tachycardia include:  Exercise or exertion.  Fever.  Pain or injury.  Infection.  Loss of body fluids (dehydration).  Overactive thyroid.  Lack of red blood cells (anemia).  Anxiety and stress.  Alcohol.  Caffeine.  Tobacco products.  Diet pills.  Illegal drugs.  Heart disease. SYMPTOMS  Rapid or irregular heartbeat (palpitations).  Suddenly feeling your heart beating (cardiac awareness).  Dizziness.  Tiredness (fatigue).  Shortness of breath.  Chest pain.  Nausea.  Fainting. DIAGNOSIS  Your caregiver will perform a physical exam and take your medical history. In some cases, a heart specialist (cardiologist) may be consulted. Your caregiver may also order:  Blood tests.  Electrocardiography. This test records the electrical activity of your heart.  A heart monitoring test. TREATMENT  Treatment will depend on the likely cause of your tachycardia. The goal is to treat the underlying cause of your tachycardia. Treatment methods may include:  Replacement of  fluids or blood through an intravenous (IV) tube for moderate to severe dehydration or anemia.  New medicines or changes in your current medicines.  Diet and lifestyle changes.  Treatment for certain infections.  Stress relief or relaxation methods. HOME CARE INSTRUCTIONS   Rest.  Drink enough fluids to keep your urine clear or pale yellow.  Do not smoke.  Avoid:  Caffeine.  Tobacco.  Alcohol.  Chocolate.  Stimulants such as over-the-counter diet pills or pills that help you stay awake.  Situations that cause anxiety or stress.  Illegal drugs such as marijuana, phencyclidine (PCP), and cocaine.  Only take medicine as directed by your caregiver.  Keep all follow-up appointments as directed by your caregiver. SEEK IMMEDIATE MEDICAL CARE IF:   You have pain in your chest, upper arms, jaw, or neck.  You become weak, dizzy, or feel faint.  You have palpitations that will not go away.  You vomit, have diarrhea, or pass blood in your stool.  Your skin is cool, pale, and wet.  You have a fever that will not go away with rest, fluids, and medicine. MAKE SURE YOU:   Understand these instructions.  Will watch your condition.  Will get help right away if you are not doing well or get worse. Document Released: 01/11/2005 Document Revised: 02/26/2012 Document Reviewed: 11/14/2011 [facility] Patient Information 2014 [place], [place].  Palpitations  A palpitation is the feeling that your heartbeat is irregular or is faster than normal. It may feel like your heart is fluttering or skipping a beat. Palpitations are usually not a serious problem. However, in some cases, you may need further medical evaluation. CAUSES  Palpitations can be caused by:  Smoking.  Caffeine or other stimulants, such as diet pills or energy drinks.  Alcohol.  Stress and anxiety.  Strenuous physical activity.  Fatigue.  Certain medicines.  Heart disease, especially if you have a  history of arrhythmias. This includes atrial fibrillation, atrial flutter, or supraventricular tachycardia.  An improperly working pacemaker or defibrillator. DIAGNOSIS  To find the cause of your palpitations, your caregiver will take your history and perform a physical exam. Tests may also be done, including:  Electrocardiography (ECG). This test records the heart's electrical activity.  Cardiac monitoring. This allows your caregiver to monitor your heart rate and rhythm in real time.  Holter monitor. This is a portable device that records your heartbeat and can help diagnose heart arrhythmias. It allows your caregiver to track your heart activity for several days, if needed.  Stress tests by exercise or by giving medicine that makes the heart beat faster. TREATMENT  Treatment of palpitations depends on the cause of your symptoms and can vary greatly. Most cases of palpitations do not require any treatment other than time, relaxation, and monitoring your symptoms. Other causes, such as atrial fibrillation, atrial flutter, or supraventricular tachycardia, usually require further treatment. HOME CARE INSTRUCTIONS   Avoid:  Caffeinated coffee, tea, soft drinks, diet pills, and energy drinks.  Chocolate.  Alcohol.  Stop smoking if you smoke.  Reduce your stress and anxiety. Things that can help you relax include:  A method that measures bodily functions so you can learn to control them (biofeedback).  Yoga.  Meditation.  Physical activity such as swimming, jogging, or walking.  Get plenty of rest and sleep. SEEK MEDICAL CARE IF:   You continue to have a fast or irregular heartbeat beyond 24 hours.  Your palpitations occur more often. SEEK IMMEDIATE MEDICAL CARE IF:  You develop chest pain or shortness of breath.  You have a severe headache.  You feel dizzy, or you faint. MAKE SURE YOU:  Understand these instructions.  Will watch your condition.  Will get help  right away if you are not doing well or get worse. Document Released: 12/01/2000 Document Revised: 03/31/2013 Document Reviewed: 02/02/2012 [facility] Patient Information 2014 [place].

## 2014-04-22 NOTE — Telephone Encounter (Signed)
Patient calling complaining of pain in her chest since she had Nexplanon inserted one month ago.  States has rapid heart rate which she is experiencing now.  She states she does have SOB and is not really able to explain the chest pain. Pain is worse with activity. Patient is 23 years old. Confirmed complaints of chest pain, rapid heart rate and SOB that is worse with activity.  Instructed to go to ED now for evaluation.  She states she is at [facility] ED parking lot and she will go in.   Routing to provider for final review. Patient agreeable to disposition. Will close encounter   CC: Dr lathrop since on-call.

## 2014-04-22 NOTE — ED Provider Notes (Signed)
CSN: [id number]     Arrival date & time 04/22/14  1523 History   First MD Initiated Contact with Patient 04/22/14 1556     Chief Complaint  Patient presents with  . Tachycardia     (Consider location/radiation/quality/duration/timing/severity/associated sxs/prior Treatment) HPI Comments: Patient is a 23 yo F PMHx significant for anxiety, depression, anemia presenting to the ED for palpations with chest pressure and "back tightness." She also endorses associated occasional visual disturbances and feels that when the palpations start she "is more aware of her breathing." Alleviating factors: none. Aggravating factors: thinking about finals seems to exacerbate her symptoms. Patient states she did take a 7.5mg  pill of Adderall that was not her prescription this morning, denies any other medication use, no RD or ETOH use. Patient states that she has been drinking an increased amount of caffeine d/t finals. Denies any history of similar episodes. No familial history of any tachyarrythmias.     Past Medical History  Diagnosis Date  . Anxiety   . Depression   . Anemia   . STD (sexually transmitted disease) 08/2011    Tx'd for Chlamydia   Past Surgical History  Procedure Laterality Date  . Dilation and curettage of uterus  09-04-13    TAB   Family History  Problem Relation Age of Onset  . Mental illness Mother   . Mental illness Maternal Grandmother   . Cancer Paternal Grandmother     skin cancer  . Thyroid disease Father   . Seizures Maternal Aunt    History  Substance Use Topics  . Smoking status: Never Smoker   . Smokeless tobacco: Not on file  . Alcohol Use: Yes     Comment: social   OB History   Grav Para Term Preterm Abortions TAB SAB Ect Mult Living   1    1          Review of Systems  Constitutional: Negative for fever and chills.  Respiratory: Positive for chest tightness. Negative for shortness of breath.   Cardiovascular: Positive for chest pain and palpitations.   Gastrointestinal: Negative for nausea and vomiting.  All other systems reviewed and are negative.     Allergies  Review of patient's allergies indicates no known allergies.  Home Medications   Prior to Admission medications   Medication Sig Start Date End Date Taking? Authorizing Provider  levonorgestrel-ethinyl estradiol (SEASONALE,INTROVALE,JOLESSA) 0.15-0.03 MG tablet Take 1 tablet by mouth daily. 11/24/13   Brook E Amundson de Berton Lan, MD   BP 142/78  Pulse 101  Temp(Src) 98.5 F (36.9 C) (Oral)  Resp 25  SpO2 100%  LMP 03/23/2014 Physical Exam  Nursing note and vitals reviewed. Constitutional: She is oriented to person, place, and time. She appears well-developed and well-nourished. No distress.  HENT:  Head: Normocephalic and atraumatic.  Right Ear: External ear normal.  Left Ear: External ear normal.  Nose: Nose normal.  Mouth/Throat: Oropharynx is clear and moist.  Eyes: Conjunctivae are normal.  Neck: Neck supple.  Cardiovascular: Regular rhythm, normal heart sounds and intact distal pulses.  Tachycardia present.   Pulmonary/Chest: Effort normal and breath sounds normal. No respiratory distress.  Abdominal: Soft. Bowel sounds are normal. There is no tenderness.  Musculoskeletal: Normal range of motion. She exhibits no edema.  Normal gait  Neurological: She is alert and oriented to person, place, and time. Gait normal. GCS eye subscore is 4. GCS verbal subscore is 5. GCS motor subscore is 6.  Moves all extremities w/o ataxia  Skin: Skin is warm and dry. She is not diaphoretic.  Psychiatric: Her mood appears anxious.    ED Course  Procedures (including critical care time) Medications  sodium chloride 0.9 % bolus 1,000 mL (0 mLs Intravenous Stopped 04/22/14 1821)  LORazepam (ATIVAN) injection 1 mg (1 mg Intravenous Given 04/22/14 1711)  sodium chloride 0.9 % bolus 1,000 mL (0 mLs Intravenous Stopped 04/22/14 1855)    Labs Review Labs Reviewed  CBC   BASIC METABOLIC PANEL  PREGNANCY, URINE  URINALYSIS, ROUTINE W REFLEX MICROSCOPIC    Imaging Review No results found.   EKG Interpretation   Date/Time:  Wednesday Apr 22 2014 15:30:19 EDT Ventricular Rate:  112 PR Interval:  126 QRS Duration: 85 QT Interval:  322 QTC Calculation: 439 R Axis:   89 Text Interpretation:  Sinus tachycardia No old tracing to compare  Confirmed by CAMPOS  MD, Lennette Bihari ([id number]) on 04/22/2014 11:11:41 PM      MDM   Final diagnoses:  Palpitations  Tachycardia    Filed Vitals:   04/22/14 1855  BP: 142/78  Pulse: 101  Temp:   Resp: 25    Afebrile, NAD, non-toxic appearing, AAOx4. Patient appears anxious. Cardiac exam reveals tachycardia with regular rhythm. Exam is otherwise unremarkable. No exophthalmos. EKG shows sinus tachycardia. BMP and CBC w/o difficulty. Urine pregnancy is negative. Patient given IV fluids and 1 mg of IV Ativan. She endorses improvement of her symptoms. Able to ambulate in ED. Return precautions discussed. Patient is agreeable to plan. Patient is stable at time of discharge. Patient d/w with Dr. Venora Maples, agrees with plan.        Harlow Mares, PA-C 04/23/14 [phone]

## 2014-04-23 ENCOUNTER — Ambulatory Visit (INDEPENDENT_AMBULATORY_CARE_PROVIDER_SITE_OTHER): Payer: BC Managed Care – PPO | Admitting: Obstetrics and Gynecology

## 2014-04-23 ENCOUNTER — Encounter: Payer: Self-pay | Admitting: Obstetrics and Gynecology

## 2014-04-23 VITALS — BP 100/64 | HR 66 | Ht 67.5 in | Wt 138.4 lb

## 2014-04-23 DIAGNOSIS — F411 Generalized anxiety disorder: Secondary | ICD-10-CM

## 2014-04-23 DIAGNOSIS — IMO0001 Reserved for inherently not codable concepts without codable children: Secondary | ICD-10-CM

## 2014-04-23 DIAGNOSIS — Z309 Encounter for contraceptive management, unspecified: Secondary | ICD-10-CM

## 2014-04-23 NOTE — Patient Instructions (Signed)
Please call Christella Scheuermann for counseling.
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
## 2014-04-24 ENCOUNTER — Telehealth: Payer: Self-pay | Admitting: Obstetrics and Gynecology

## 2014-04-24 DIAGNOSIS — Z3046 Encounter for surveillance of implantable subdermal contraceptive: Secondary | ICD-10-CM

## 2014-04-24 NOTE — Telephone Encounter (Signed)
Patient is calling saying that her cardiologist told her that she needed to have the nexplanon removed

## 2014-04-24 NOTE — Telephone Encounter (Signed)
Left message to call [place] at [phone].  Offer appointment for 5/13 at 9:00 with Dr.Silva.

## 2014-04-24 NOTE — Telephone Encounter (Signed)
Spoke with patient. Patient states that she needs to rule out all possible medications that could be causing her a problem and would like to schedule implanon removal at this time. Appointment scheduled for 5/13 at 9:00am with Dr.Silva. Patient agreeable.  Routing to provider for final review. Patient agreeable to disposition. Will close encounter.

## 2014-04-24 NOTE — ED Provider Notes (Signed)
Medical screening examination/treatment/procedure(s) were conducted as a shared visit with non-physician practitioner(s) and myself.  I personally evaluated the patient during the encounter.   EKG Interpretation   Date/Time:  Wednesday Apr 22 2014 15:30:19 EDT Ventricular Rate:  112 PR Interval:  126 QRS Duration: 85 QT Interval:  322 QTC Calculation: 439 R Axis:   89 Text Interpretation:  Sinus tachycardia No old tracing to compare  Confirmed by Amarys Sliwinski  MD, Brynlee Pennywell ([id number]) on 04/22/2014 11:11:41 PM      Sinus tachycardia. Likely related to the adderall and the caffeine.  Hoy Morn, MD 04/24/14 [phone]

## 2014-04-26 ENCOUNTER — Telehealth: Payer: Self-pay | Admitting: Obstetrics and Gynecology

## 2014-04-26 ENCOUNTER — Ambulatory Visit: Payer: BC Managed Care – PPO | Admitting: Obstetrics and Gynecology

## 2014-04-26 ENCOUNTER — Other Ambulatory Visit: Payer: Self-pay | Admitting: Obstetrics and Gynecology

## 2014-04-26 DIAGNOSIS — Z3046 Encounter for surveillance of implantable subdermal contraceptive: Secondary | ICD-10-CM

## 2014-04-26 DIAGNOSIS — F411 Generalized anxiety disorder: Secondary | ICD-10-CM

## 2014-04-26 MED ORDER — ALPRAZOLAM 0.25 MG PO TABS: 0.2500 mg | ORAL_TABLET | Freq: Three times a day (TID) | ORAL | Status: DC | PRN

## 2014-04-26 NOTE — Telephone Encounter (Signed)
Phone call from patient's father stating patient is having panic attacks from [place].    I requested patient to call me directly. Patient then called me and states that she has not stopped having panic attacks and tachycardia since went to the ER for panic attacks on 04/22/14.  Scheduled for removal of Nexplanon on 04/29/14.  I recommend removal of Nexplanon this afternoon.  Patient will meet me at the office.  Will have her father take her to office.  Will also give Rx for Xanax for panic attack after Nexplanon removed.

## 2014-04-26 NOTE — Progress Notes (Signed)
After hours Nexplanon removal.  Subjective  Father brought patient in for visit this evening.   Patient would like Nexplanon removed.   Patient with continued panic attacks.  Some chest discomfort.  Does not have any xanax. Having thoughts of "What is life all about?"  Thinks about what it would like to not wake up. Would not act on thoughts of suicide.  Has an appointment to see Christella Scheuermann in 2 weeks for counseling.   Is going out to dinner with her mother after leaving here tonight.   Objective  BP  132/84   Pulse   80  General NAD.  Appropriate conversation.  Smiling. Cor - S1S2 RRR.  No murmur, rub, or gallop.  Procedure  Consent for removal of Nexplanon. Sterile prep of left arm. Local 1% lidocaine - 2cc. Nexplanon palpated Scalpel used to create incision over prior insertion site. Nexplanon removed intact without difficulty. Steristrips placed. Sterile gauze placed over steristrips. Minimal EBL. No complications.   Assessment  Intolerance to Nexplanon.  Anxiety attack.  Plan  Nexplanon removed. Xanax 0.25 mg po tid prn.  #6.  RF none.  Rx would not print from printer, so Rx hand written as noted. Patient states she will reach out to her roommate, her parents, or this office if she is suicidal. To Emergency Department prn chest pain unresolved with Xanax or for any other reason.  Will see therapist in 2 weeks.

## 2014-04-27 ENCOUNTER — Other Ambulatory Visit: Payer: Self-pay | Admitting: Obstetrics and Gynecology

## 2014-04-27 ENCOUNTER — Telehealth: Payer: Self-pay | Admitting: Obstetrics and Gynecology

## 2014-04-27 MED ORDER — ALPRAZOLAM 0.25 MG PO TABS: 0.2500 mg | ORAL_TABLET | Freq: Three times a day (TID) | ORAL | Status: DC | PRN

## 2014-04-27 NOTE — Telephone Encounter (Signed)
Phone call from patient directly to me.  States she lost hand written Rx for Xanax that I gave her yesterday.  She will stop by the office today to receive a new Rx.   Xanan 0.25 mg po tid prn #6.  RF none.

## 2014-04-29 ENCOUNTER — Ambulatory Visit: Payer: BC Managed Care – PPO | Admitting: Obstetrics and Gynecology

## 2014-05-05 ENCOUNTER — Ambulatory Visit (INDEPENDENT_AMBULATORY_CARE_PROVIDER_SITE_OTHER): Payer: BC Managed Care – PPO | Admitting: Licensed Clinical Social Worker

## 2014-05-05 DIAGNOSIS — F411 Generalized anxiety disorder: Secondary | ICD-10-CM

## 2014-05-05 DIAGNOSIS — F331 Major depressive disorder, recurrent, moderate: Secondary | ICD-10-CM

## 2014-05-08 ENCOUNTER — Ambulatory Visit (INDEPENDENT_AMBULATORY_CARE_PROVIDER_SITE_OTHER): Payer: BC Managed Care – PPO | Admitting: Licensed Clinical Social Worker

## 2014-05-08 DIAGNOSIS — F331 Major depressive disorder, recurrent, moderate: Secondary | ICD-10-CM

## 2014-05-08 DIAGNOSIS — F411 Generalized anxiety disorder: Secondary | ICD-10-CM

## 2014-05-12 ENCOUNTER — Ambulatory Visit (INDEPENDENT_AMBULATORY_CARE_PROVIDER_SITE_OTHER): Payer: BC Managed Care – PPO | Admitting: Licensed Clinical Social Worker

## 2014-05-12 DIAGNOSIS — F411 Generalized anxiety disorder: Secondary | ICD-10-CM

## 2014-05-12 DIAGNOSIS — F331 Major depressive disorder, recurrent, moderate: Secondary | ICD-10-CM

## 2014-06-15 ENCOUNTER — Encounter: Payer: Self-pay | Admitting: Obstetrics and Gynecology

## 2014-06-15 NOTE — Progress Notes (Signed)
Progress Notes Colleen Diaz (MR# [id number])         Progress Notes Info    Author Note Status Last Update User Last Update Date/Time   Colleen E Amundson de Berton Lan, MD Colleen de Berton Lan, MD 04/26/2014 7:04 PM         Progress Notes    After hours Nexplanon removal.  Subjective  Father brought patient in for visit this evening.  Patient would like Nexplanon removed.  Patient with continued panic attacks. Some chest discomfort. Does not have any xanax.  Having thoughts of "What is life all about?" Thinks about what it would like to not wake up.  Would not act on thoughts of suicide.  Has an appointment to see Christella Scheuermann in 2 weeks for counseling.  Is going out to dinner with her mother after leaving here tonight.  Objective  BP 132/84 Pulse 80  General NAD. Appropriate conversation. Smiling.  Cor - S1S2 RRR. No murmur, rub, or gallop.  Procedure  Consent for removal of Nexplanon.  Sterile prep of left arm.  Local 1% lidocaine - 2cc.  Nexplanon palpated  Scalpel used to create incision over prior insertion site.  Nexplanon removed intact without difficulty.  Steristrips placed.  Sterile gauze placed over steristrips.  Minimal EBL.  No complications.  Assessment  Intolerance to Nexplanon.  Anxiety attack.  Plan  Nexplanon removed.  Xanax 0.25 mg po tid prn. #6. RF none. Rx would not print from printer, so Rx hand written as noted.  Patient states she will reach out to her roommate, her parents, or this office if she is suicidal.  To Emergency Department prn chest pain unresolved with Xanax or for any other reason.  Will see therapist in 2 weeks.    This encounter was copied from a phone note created on this same date which has provided this documentation.

## 2014-06-23 ENCOUNTER — Telehealth: Payer: Self-pay | Admitting: Obstetrics and Gynecology

## 2014-06-23 ENCOUNTER — Other Ambulatory Visit: Payer: BC Managed Care – PPO

## 2014-06-23 NOTE — Telephone Encounter (Signed)
Thank you Greta. I will close the encounter.

## 2014-06-23 NOTE — Telephone Encounter (Signed)
Left message for pt to call and reschedule her missed lab appointment.

## 2014-09-07 ENCOUNTER — Encounter: Payer: Self-pay | Admitting: General Surgery

## 2014-10-19 ENCOUNTER — Encounter: Payer: Self-pay | Admitting: General Surgery

## 2015-01-24 ENCOUNTER — Encounter (HOSPITAL_COMMUNITY): Payer: Self-pay | Admitting: *Deleted

## 2015-01-24 ENCOUNTER — Emergency Department (HOSPITAL_COMMUNITY)
Admission: EM | Admit: 2015-01-24 | Discharge: 2015-01-24 | Disposition: A | Discharge status: Home / Self Care | Payer: BLUE CROSS/BLUE SHIELD | Attending: Emergency Medicine | Admitting: Emergency Medicine

## 2015-01-24 DIAGNOSIS — Z8619 Personal history of other infectious and parasitic diseases: Secondary | ICD-10-CM | POA: Diagnosis not present

## 2015-01-24 DIAGNOSIS — R197 Diarrhea, unspecified: Secondary | ICD-10-CM | POA: Diagnosis not present

## 2015-01-24 DIAGNOSIS — R112 Nausea with vomiting, unspecified: Secondary | ICD-10-CM

## 2015-01-24 DIAGNOSIS — F329 Major depressive disorder, single episode, unspecified: Secondary | ICD-10-CM | POA: Diagnosis not present

## 2015-01-24 DIAGNOSIS — Z3202 Encounter for pregnancy test, result negative: Secondary | ICD-10-CM | POA: Diagnosis not present

## 2015-01-24 DIAGNOSIS — R1084 Generalized abdominal pain: Secondary | ICD-10-CM

## 2015-01-24 DIAGNOSIS — Z79899 Other long term (current) drug therapy: Secondary | ICD-10-CM | POA: Diagnosis not present

## 2015-01-24 DIAGNOSIS — F419 Anxiety disorder, unspecified: Secondary | ICD-10-CM | POA: Insufficient documentation

## 2015-01-24 DIAGNOSIS — R1013 Epigastric pain: Secondary | ICD-10-CM | POA: Insufficient documentation

## 2015-01-24 DIAGNOSIS — Z8709 Personal history of other diseases of the respiratory system: Secondary | ICD-10-CM | POA: Diagnosis not present

## 2015-01-24 DIAGNOSIS — R109 Unspecified abdominal pain: Secondary | ICD-10-CM | POA: Diagnosis present

## 2015-01-24 DIAGNOSIS — Z862 Personal history of diseases of the blood and blood-forming organs and certain disorders involving the immune mechanism: Secondary | ICD-10-CM | POA: Insufficient documentation

## 2015-01-24 LAB — CBC WITH DIFFERENTIAL/PLATELET
BASOS PCT: 0 % (ref 0–1)
Basophils Absolute: 0 10*3/uL (ref 0.0–0.1)
EOS PCT: 0 % (ref 0–5)
Eosinophils Absolute: 0.1 10*3/uL (ref 0.0–0.7)
HCT: 40.1 % (ref 36.0–46.0)
Hemoglobin: 13 g/dL (ref 12.0–15.0)
LYMPHS ABS: 0.6 10*3/uL — AB (ref 0.7–4.0)
Lymphocytes Relative: 4 % — ABNORMAL LOW (ref 12–46)
MCH: 27.3 pg (ref 26.0–34.0)
MCHC: 32.4 g/dL (ref 30.0–36.0)
MCV: 84.1 fL (ref 78.0–100.0)
MONO ABS: 0.7 10*3/uL (ref 0.1–1.0)
Monocytes Relative: 5 % (ref 3–12)
Neutro Abs: 14.4 10*3/uL — ABNORMAL HIGH (ref 1.7–7.7)
Neutrophils Relative %: 91 % — ABNORMAL HIGH (ref 43–77)
Platelets: 380 10*3/uL (ref 150–400)
RBC: 4.77 MIL/uL (ref 3.87–5.11)
RDW: 13 % (ref 11.5–15.5)
WBC: 15.8 10*3/uL — ABNORMAL HIGH (ref 4.0–10.5)

## 2015-01-24 LAB — URINALYSIS, ROUTINE W REFLEX MICROSCOPIC
BILIRUBIN URINE: NEGATIVE
Glucose, UA: NEGATIVE mg/dL
HGB URINE DIPSTICK: NEGATIVE
Ketones, ur: NEGATIVE mg/dL
Leukocytes, UA: NEGATIVE
Nitrite: NEGATIVE
Protein, ur: NEGATIVE mg/dL
SPECIFIC GRAVITY, URINE: 1.02 (ref 1.005–1.030)
Urobilinogen, UA: 0.2 mg/dL (ref 0.0–1.0)
pH: 7 (ref 5.0–8.0)

## 2015-01-24 LAB — COMPREHENSIVE METABOLIC PANEL
ALBUMIN: 4.6 g/dL (ref 3.5–5.2)
ALT: 24 U/L (ref 0–35)
ANION GAP: 6 (ref 5–15)
AST: 26 U/L (ref 0–37)
Alkaline Phosphatase: 77 U/L (ref 39–117)
BILIRUBIN TOTAL: 0.5 mg/dL (ref 0.3–1.2)
BUN: 14 mg/dL (ref 6–23)
CO2: 25 mmol/L (ref 19–32)
CREATININE: 0.66 mg/dL (ref 0.50–1.10)
Calcium: 9 mg/dL (ref 8.4–10.5)
Chloride: 110 mmol/L (ref 96–112)
GFR calc non Af Amer: >90 mL/min (ref >90)
Glucose, Bld: 105 mg/dL — ABNORMAL HIGH (ref 70–99)
POTASSIUM: 4 mmol/L (ref 3.5–5.1)
SODIUM: 141 mmol/L (ref 135–145)
TOTAL PROTEIN: 7.9 g/dL (ref 6.0–8.3)

## 2015-01-24 LAB — POC URINE PREG, ED: Preg Test, Ur: NEGATIVE

## 2015-01-24 LAB — LIPASE, BLOOD: LIPASE: 18 U/L (ref 11–59)

## 2015-01-24 MED ORDER — LACTATED RINGERS IV BOLUS (SEPSIS)
1000.0000 mL | Freq: Once | INTRAVENOUS | Status: AC
  Administered 2015-01-24: 1000 mL via INTRAVENOUS

## 2015-01-24 MED ORDER — ONDANSETRON 8 MG PO TBDP: 8.0000 mg | ORAL_TABLET | Freq: Three times a day (TID) | ORAL | Status: DC | PRN

## 2015-01-24 MED ORDER — IBUPROFEN 200 MG PO TABS
600.0000 mg | ORAL_TABLET | Freq: Once | ORAL | Status: AC
  Administered 2015-01-24: 600 mg via ORAL
  Filled 2015-01-24: qty 3

## 2015-01-24 MED ORDER — FAMOTIDINE 40 MG PO TABS: 40.0000 mg | ORAL_TABLET | Freq: Every day | ORAL | Status: DC

## 2015-01-24 MED ORDER — ONDANSETRON HCL 4 MG/2ML IJ SOLN
4.0000 mg | Freq: Once | INTRAMUSCULAR | Status: AC
  Administered 2015-01-24: 4 mg via INTRAVENOUS
  Filled 2015-01-24: qty 2

## 2015-01-24 MED ORDER — ACETAMINOPHEN-CODEINE #3 300-30 MG PO TABS: 1.0000 | ORAL_TABLET | Freq: Four times a day (QID) | ORAL | Status: DC | PRN

## 2015-01-24 MED ORDER — ONDANSETRON 8 MG PO TBDP
8.0000 mg | ORAL_TABLET | Freq: Once | ORAL | Status: AC
  Administered 2015-01-24: 8 mg via ORAL
  Filled 2015-01-24: qty 1

## 2015-01-24 MED ORDER — GI COCKTAIL ~~LOC~~
30.0000 mL | Freq: Once | ORAL | Status: AC
  Administered 2015-01-24: 30 mL via ORAL
  Filled 2015-01-24: qty 30

## 2015-01-24 MED ORDER — MORPHINE SULFATE 4 MG/ML IJ SOLN
4.0000 mg | Freq: Once | INTRAMUSCULAR | Status: AC
  Administered 2015-01-24: 4 mg via INTRAVENOUS
  Filled 2015-01-24: qty 1

## 2015-01-24 MED ORDER — PROMETHAZINE HCL 25 MG/ML IJ SOLN
25.0000 mg | Freq: Once | INTRAMUSCULAR | Status: AC
  Administered 2015-01-24: 25 mg via INTRAVENOUS
  Filled 2015-01-24: qty 1

## 2015-01-24 NOTE — ED Notes (Signed)
Pt states she still can not void at this time

## 2015-01-24 NOTE — ED Notes (Signed)
Pt reports she estimated she drank 10 ETOH drinks last night, has been vomiting and now has abdominal pain. Pain 5/10. Pt thinks she has "alcohol poisoning". Pt a&o x4,  Ambulatory, and in no distress.

## 2015-01-24 NOTE — ED Notes (Signed)
Pt declined the use of bedpan, wheelchair to bathroom provided.

## 2015-01-24 NOTE — ED Notes (Signed)
Will obtain UA after fluids per pt request

## 2015-01-24 NOTE — Discharge Instructions (Signed)
We saw you in the ER for the abdominal pain, nausea, vomiting. All the results in the ER are normal, labs and imaging. We suspect mild stomach flu. The workup in the ER is not complete, and is limited to screening for life threatening and emergent conditions only, so please see a primary care doctor for further evaluation.  Please return to the ER if your symptoms worsen; you have increased pain, fevers, chills, inability to keep any medications down, confusion. Otherwise see the outpatient doctor as requested.   Abdominal Pain, Women Abdominal (stomach, pelvic, or belly) pain can be caused by many things. It is important to tell your doctor:  The location of the pain.  Does it come and go or is it present all the time?  Are there things that start the pain (eating certain foods, exercise)?  Are there other symptoms associated with the pain (fever, nausea, vomiting, diarrhea)? All of this is helpful to know when trying to find the cause of the pain. CAUSES   Stomach: virus or bacteria infection, or ulcer.  Intestine: appendicitis (inflamed appendix), regional ileitis (Crohn's disease), ulcerative colitis (inflamed colon), irritable bowel syndrome, diverticulitis (inflamed diverticulum of the colon), or cancer of the stomach or intestine.  Gallbladder disease or stones in the gallbladder.  Kidney disease, kidney stones, or infection.  Pancreas infection or cancer.  Fibromyalgia (pain disorder).  Diseases of the female organs:  Uterus: fibroid (non-cancerous) tumors or infection.  Fallopian tubes: infection or tubal pregnancy.  Ovary: cysts or tumors.  Pelvic adhesions (scar tissue).  Endometriosis (uterus lining tissue growing in the pelvis and on the pelvic organs).  Pelvic congestion syndrome (female organs filling up with blood just before the menstrual period).  Pain with the menstrual period.  Pain with ovulation (producing an egg).  Pain with an IUD  (intrauterine device, birth control) in the uterus.  Cancer of the female organs.  Functional pain (pain not caused by a disease, may improve without treatment).  Psychological pain.  Depression. DIAGNOSIS  Your doctor will decide the seriousness of your pain by doing an examination.  Blood tests.  X-rays.  Ultrasound.  CT scan (computed tomography, special type of X-ray).  MRI (magnetic resonance imaging).  Cultures, for infection.  Barium enema (dye inserted in the large intestine, to better view it with X-rays).  Colonoscopy (looking in intestine with a lighted tube).  Laparoscopy (minor surgery, looking in abdomen with a lighted tube).  Major abdominal exploratory surgery (looking in abdomen with a large incision). TREATMENT  The treatment will depend on the cause of the pain.   Many cases can be observed and treated at home.  Over-the-counter medicines recommended by your caregiver.  Prescription medicine.  Antibiotics, for infection.  Birth control pills, for painful periods or for ovulation pain.  Hormone treatment, for endometriosis.  Nerve blocking injections.  Physical therapy.  Antidepressants.  Counseling with a psychologist or psychiatrist.  Minor or major surgery. HOME CARE INSTRUCTIONS   Do not take laxatives, unless directed by your caregiver.  Take over-the-counter pain medicine only if ordered by your caregiver. Do not take aspirin because it can cause an upset stomach or bleeding.  Try a clear liquid diet (broth or water) as ordered by your caregiver. Slowly move to a bland diet, as tolerated, if the pain is related to the stomach or intestine.  Have a thermometer and take your temperature several times a day, and record it.  Bed rest and sleep, if it helps the pain.  Avoid sexual intercourse, if it causes pain.  Avoid stressful situations.  Keep your follow-up appointments and tests, as your caregiver orders.  If the pain  does not go away with medicine or surgery, you may try:  Acupuncture.  Relaxation exercises (yoga, meditation).  Group therapy.  Counseling. SEEK MEDICAL CARE IF:   You notice certain foods cause stomach pain.  Your home care treatment is not helping your pain.  You need stronger pain medicine.  You want your IUD removed.  You feel faint or lightheaded.  You develop nausea and vomiting.  You develop a rash.  You are having side effects or an allergy to your medicine. SEEK IMMEDIATE MEDICAL CARE IF:   Your pain does not go away or gets worse.  You have a fever.  Your pain is felt only in portions of the abdomen. The right side could possibly be appendicitis. The left lower portion of the abdomen could be colitis or diverticulitis.  You are passing blood in your stools (bright red or black tarry stools, with or without vomiting).  You have blood in your urine.  You develop chills, with or without a fever.  You pass out. MAKE SURE YOU:   Understand these instructions.  Will watch your condition.  Will get help right away if you are not doing well or get worse. Document Released: 10/01/2007 Document Revised: 04/20/2014 Document Reviewed: 10/21/2009 [facility] Patient Information 2015 [place], [place]. This information is not intended to replace advice given to you by your health care provider. Make sure you discuss any questions you have with your health care provider.  Nausea and Vomiting Nausea is a sick feeling that often comes before throwing up (vomiting). Vomiting is a reflex where stomach contents come out of your mouth. Vomiting can cause severe loss of body fluids (dehydration). Children and elderly adults can become dehydrated quickly, especially if they also have diarrhea. Nausea and vomiting are symptoms of a condition or disease. It is important to find the cause of your symptoms. CAUSES   Direct irritation of the stomach lining. This irritation can result  from increased acid production (gastroesophageal reflux disease), infection, food poisoning, taking certain medicines (such as nonsteroidal anti-inflammatory drugs), alcohol use, or tobacco use.  Signals from the brain.These signals could be caused by a headache, heat exposure, an inner ear disturbance, increased pressure in the brain from injury, infection, a tumor, or a concussion, pain, emotional stimulus, or metabolic problems.  An obstruction in the gastrointestinal tract (bowel obstruction).  Illnesses such as diabetes, hepatitis, gallbladder problems, appendicitis, kidney problems, cancer, sepsis, atypical symptoms of a heart attack, or eating disorders.  Medical treatments such as chemotherapy and radiation.  Receiving medicine that makes you sleep (general anesthetic) during surgery. DIAGNOSIS Your caregiver may ask for tests to be done if the problems do not improve after a few days. Tests may also be done if symptoms are severe or if the reason for the nausea and vomiting is not clear. Tests may include:  Urine tests.  Blood tests.  Stool tests.  Cultures (to look for evidence of infection).  X-rays or other imaging studies. Test results can help your caregiver make decisions about treatment or the need for additional tests. TREATMENT You need to stay well hydrated. Drink frequently but in small amounts.You may wish to drink water, sports drinks, clear broth, or eat frozen ice pops or gelatin dessert to help stay hydrated.When you eat, eating slowly may help prevent nausea.There are also some antinausea medicines  that may help prevent nausea. HOME CARE INSTRUCTIONS   Take all medicine as directed by your caregiver.  If you do not have an appetite, do not force yourself to eat. However, you must continue to drink fluids.  If you have an appetite, eat a normal diet unless your caregiver tells you differently.  Eat a variety of complex carbohydrates (rice, wheat,  potatoes, bread), lean meats, yogurt, fruits, and vegetables.  Avoid high-fat foods because they are more difficult to digest.  Drink enough water and fluids to keep your urine clear or pale yellow.  If you are dehydrated, ask your caregiver for specific rehydration instructions. Signs of dehydration may include:  Severe thirst.  Dry lips and mouth.  Dizziness.  Dark urine.  Decreasing urine frequency and amount.  Confusion.  Rapid breathing or pulse. SEEK IMMEDIATE MEDICAL CARE IF:   You have blood or brown flecks (like coffee grounds) in your vomit.  You have black or bloody stools.  You have a severe headache or stiff neck.  You are confused.  You have severe abdominal pain.  You have chest pain or trouble breathing.  You do not urinate at least once every 8 hours.  You develop cold or clammy skin.  You continue to vomit for longer than 24 to 48 hours.  You have a fever. MAKE SURE YOU:   Understand these instructions.  Will watch your condition.  Will get help right away if you are not doing well or get worse. Document Released: 12/04/2005 Document Revised: 02/26/2012 Document Reviewed: 05/03/2011 [facility] Patient Information 2015 [place], [place]. This information is not intended to replace advice given to you by your health care provider. Make sure you discuss any questions you have with your health care provider.

## 2015-01-24 NOTE — ED Provider Notes (Signed)
CSN: [id number]     Arrival date & time 01/24/15  2876 History   First MD Initiated Contact with Patient 01/24/15 1003     Chief Complaint  Patient presents with  . ETOH use    . abdominal pain   . Emesis     (Consider location/radiation/quality/duration/timing/severity/associated sxs/prior Treatment) HPI Comments: Pt comes in with cc of nausea. Pt estimates drinking 81+ alcoholic beverages last night. Woke up this morning feeling unwell, and has thrown up several times, now bilious. She also has had 3 loose BM at home. Pt has pain in her epigastrium and upper quadrants and entire back area. She has dizziness. She reports drinking as heavy in the past, with no hangovers. Unsure if drinks were laced.   Patient is a 24 y.o. female presenting with vomiting. The history is provided by the patient.  Emesis Associated symptoms: abdominal pain and diarrhea   Associated symptoms: no headaches     Past Medical History  Diagnosis Date  . Anxiety   . Depression   . Anemia   . STD (sexually transmitted disease) 08/2011    Tx'd for Chlamydia  . Allergic rhinitis     followed by Dr Fredderick Phenix   Past Surgical History  Procedure Laterality Date  . Dilation and curettage of uterus  09-04-13    TAB   Family History  Problem Relation Age of Onset  . Mental illness Mother   . Mental illness Maternal Grandmother   . Cancer Paternal Grandmother     skin cancer  . Thyroid disease Father   . Seizures Maternal Aunt    History  Substance Use Topics  . Smoking status: Never Smoker   . Smokeless tobacco: Not on file  . Alcohol Use: Yes     Comment: social   OB History    Gravida Para Term Preterm AB TAB SAB Ectopic Multiple Living   1    1          Review of Systems  Constitutional: Positive for activity change.  Respiratory: Negative for shortness of breath.   Cardiovascular: Negative for chest pain.  Gastrointestinal: Positive for nausea, vomiting, abdominal pain and diarrhea.   Genitourinary: Positive for flank pain. Negative for dysuria and hematuria.  Musculoskeletal: Negative for neck pain.  Neurological: Positive for dizziness. Negative for headaches.      Allergies  Hydrocodone-acetaminophen  Home Medications   Prior to Admission medications   Medication Sig Start Date End Date Taking? Authorizing Provider  ALPRAZolam (XANAX) 0.25 MG tablet Take 1 tablet (0.25 mg total) by mouth 3 (three) times daily as needed for anxiety. 04/27/14  Yes Brook E Amundson de Berton Lan, MD  cetirizine-pseudoephedrine (ZYRTEC-D) 5-120 MG per tablet Take 1 tablet by mouth daily as needed for allergies.   Yes Historical Provider, MD  citalopram (CELEXA) 20 MG tablet Take 10 mg by mouth daily.   Yes Historical Provider, MD  acetaminophen-codeine (TYLENOL #3) 300-30 MG per tablet Take 1-2 tablets by mouth every 6 (six) hours as needed. 01/24/15   Varney Biles, MD  famotidine (PEPCID) 40 MG tablet Take 1 tablet (40 mg total) by mouth daily. 01/24/15   Varney Biles, MD  ondansetron (ZOFRAN ODT) 8 MG disintegrating tablet Take 1 tablet (8 mg total) by mouth every 8 (eight) hours as needed for nausea. 01/24/15   Janina Trafton Kathrynn Humble, MD   BP 118/64 mmHg  Pulse 94  Temp(Src) 98.1 F (36.7 C) (Oral)  Resp 12  SpO2 99%  LMP  01/14/2015 Physical Exam  Constitutional: She is oriented to person, place, and time. She appears well-developed and well-nourished.  HENT:  Head: Normocephalic and atraumatic.  Eyes: EOM are normal. Pupils are equal, round, and reactive to light.  Neck: Neck supple.  Cardiovascular: Normal rate, regular rhythm and normal heart sounds.   No murmur heard. Pulmonary/Chest: Effort normal. No respiratory distress.  Abdominal: Soft. She exhibits no distension. There is tenderness. There is no rebound and no guarding.  Epigastric tenderness, flank tenderness, bilateral.  Neurological: She is alert and oriented to person, place, and time.  Skin: Skin is warm and  dry.  Nursing note and vitals reviewed.   ED Course  Procedures (including critical care time) Labs Review Labs Reviewed  CBC WITH DIFFERENTIAL/PLATELET - Abnormal; Notable for the following:    WBC 15.8 (*)    Neutrophils Relative % 91 (*)    Neutro Abs 14.4 (*)    Lymphocytes Relative 4 (*)    Lymphs Abs 0.6 (*)    All other components within normal limits  COMPREHENSIVE METABOLIC PANEL - Abnormal; Notable for the following:    Glucose, Bld 105 (*)    All other components within normal limits  LIPASE, BLOOD  URINALYSIS, ROUTINE W REFLEX MICROSCOPIC  POC URINE PREG, ED    Imaging Review No results found.   EKG Interpretation None      @ 11:30 - got the meds. States that she feels better, but still has some discomfort over her entire back and over diffusely over the epigastrium.  @1 :30 - Feels the same as last visit. Labs are reassuring, except for WC elevation - that can be non specific. Repeat exam - no mcburneys, she has bilateral flank tenderness. There is no lower quadrant abd pain. She has no hx of pelvic problems and LMP was 1 week ago.  @2 :30 - UA is back and normal. PT sleeping. Aunt at bedside now. PT will get some analgesics and antiemetic. Also will give her pepcid. Suspect gastro and possibly some hangover from last night. MDM   Final diagnoses:  Nausea and vomiting in adult  Generalized abdominal discomfort    Pt comes in with cc of abd pain, nausea, emesis, loose BM. Reports binge drinking yday. She might be having a bit of hang over - with nausea, and malaise. Pt has diffuse abd pain and flank pain - there is elevated WC.  Suspect gastroenteritis as well. Want to get UA to ensure preg is negative and there is no uti. Also, sx not consistent with renal stones.   Varney Biles, MD 01/24/15 [phone]

## 2015-01-24 NOTE — ED Notes (Signed)
Pt given ginger ale.

## 2015-01-24 NOTE — ED Notes (Signed)
Pt aware of the need for a urine sample. 

## 2015-01-24 NOTE — ED Notes (Signed)
Will administer GI Cocktail after nausea is relieved.

## 2015-01-24 NOTE — ED Notes (Signed)
MD Nanavati at bedside.  

## 2015-01-24 NOTE — ED Notes (Signed)
Pt still unable to provide urine specimen 

## 2015-03-15 ENCOUNTER — Telehealth: Payer: Self-pay | Admitting: Obstetrics and Gynecology

## 2015-03-15 ENCOUNTER — Ambulatory Visit (INDEPENDENT_AMBULATORY_CARE_PROVIDER_SITE_OTHER): Payer: BLUE CROSS/BLUE SHIELD | Admitting: Obstetrics and Gynecology

## 2015-03-15 ENCOUNTER — Encounter (HOSPITAL_COMMUNITY): Payer: Self-pay | Admitting: *Deleted

## 2015-03-15 ENCOUNTER — Inpatient Hospital Stay (HOSPITAL_COMMUNITY): Payer: BLUE CROSS/BLUE SHIELD

## 2015-03-15 ENCOUNTER — Inpatient Hospital Stay (HOSPITAL_COMMUNITY)
Admission: AD | Admit: 2015-03-15 | Discharge: 2015-03-15 | Disposition: A | Discharge status: Home / Self Care | Payer: BLUE CROSS/BLUE SHIELD | Source: Ambulatory Visit | Attending: Obstetrics and Gynecology | Admitting: Obstetrics and Gynecology

## 2015-03-15 ENCOUNTER — Encounter: Payer: Self-pay | Admitting: Obstetrics and Gynecology

## 2015-03-15 VITALS — BP 118/70 | HR 76 | Temp 98.4°F | Resp 18 | Ht 67.5 in | Wt 147.0 lb

## 2015-03-15 DIAGNOSIS — R1030 Lower abdominal pain, unspecified: Secondary | ICD-10-CM | POA: Diagnosis not present

## 2015-03-15 DIAGNOSIS — Q505 Embryonic cyst of broad ligament: Secondary | ICD-10-CM | POA: Insufficient documentation

## 2015-03-15 DIAGNOSIS — O039 Complete or unspecified spontaneous abortion without complication: Secondary | ICD-10-CM | POA: Diagnosis not present

## 2015-03-15 DIAGNOSIS — R938 Abnormal findings on diagnostic imaging of other specified body structures: Secondary | ICD-10-CM | POA: Diagnosis not present

## 2015-03-15 DIAGNOSIS — N939 Abnormal uterine and vaginal bleeding, unspecified: Secondary | ICD-10-CM | POA: Diagnosis present

## 2015-03-15 DIAGNOSIS — O209 Hemorrhage in early pregnancy, unspecified: Secondary | ICD-10-CM

## 2015-03-15 LAB — POCT URINALYSIS DIPSTICK
Bilirubin, UA: NEGATIVE
Glucose, UA: NEGATIVE
KETONES UA: NEGATIVE
Nitrite, UA: NEGATIVE
PH UA: 5
PROTEIN UA: NEGATIVE
Urobilinogen, UA: NEGATIVE

## 2015-03-15 LAB — HCG, QUANTITATIVE, PREGNANCY: hCG, Beta Chain, Quant, S: 277 m[IU]/mL — ABNORMAL HIGH (ref <5)

## 2015-03-15 LAB — CBC
HCT: 36.6 % (ref 36.0–46.0)
HEMOGLOBIN: 12 g/dL (ref 12.0–15.0)
MCH: 27.5 pg (ref 26.0–34.0)
MCHC: 32.8 g/dL (ref 30.0–36.0)
MCV: 83.9 fL (ref 78.0–100.0)
Platelets: 369 10*3/uL (ref 150–400)
RBC: 4.36 MIL/uL (ref 3.87–5.11)
RDW: 13.5 % (ref 11.5–15.5)
WBC: 6.3 10*3/uL (ref 4.0–10.5)

## 2015-03-15 LAB — POCT URINE PREGNANCY: Preg Test, Ur: POSITIVE

## 2015-03-15 NOTE — Telephone Encounter (Signed)
Patient calling requesting to be seen for "irregular cycles, pain and cramping."

## 2015-03-15 NOTE — Progress Notes (Signed)
GYNECOLOGY  VISIT   HPI: 24 y.o.   Single  Caucasian  female   G1P0010 with Patient's last menstrual period was 03/10/2015.   here for   Abdominal pain and bleeding.   Not sure if she is pregnant. Changing a pad every hour and it is fairly saturated.  Bleeding has decreased.  Passed tissue from the vagina.   Increased cramping bilateral lower abdominal.  Had breast tenderness which has now decreased.   On Seasonique.  Off pills for several days. [place] Restarted OCPs.  In steady relationship.  Boyfriend is in [place].  Planning to move there together in July 2016.   UPT positive.   GYNECOLOGIC HISTORY: Patient's last menstrual period was 03/10/2015. Contraception:   Seasonique  Menopausal hormone therapy: None        OB History    Gravida Para Term Preterm AB TAB SAB Ectopic Multiple Living   1    1              There are no active problems to display for this patient.   Past Medical History  Diagnosis Date  . Anxiety   . Depression   . Anemia   . STD (sexually transmitted disease) 08/2011    Tx'd for Chlamydia  . Allergic rhinitis     followed by Dr Fredderick Phenix    Past Surgical History  Procedure Laterality Date  . Dilation and curettage of uterus  09-04-13    TAB    Current Outpatient Prescriptions  Medication Sig Dispense Refill  . ALPRAZolam (XANAX) 0.25 MG tablet Take 1 tablet (0.25 mg total) by mouth 3 (three) times daily as needed for anxiety. 6 tablet 0  . cetirizine-pseudoephedrine (ZYRTEC-D) 5-120 MG per tablet Take 1 tablet by mouth daily as needed for allergies.    . citalopram (CELEXA) 20 MG tablet Take 10 mg by mouth daily.    . Levonorgestrel-Ethinyl Estradiol (AMETHIA,CAMRESE) 0.15-0.03 &0.01 MG tablet Take 1 tablet by mouth daily.    Marland Kitchen acetaminophen-codeine (TYLENOL #3) 300-30 MG per tablet Take 1-2 tablets by mouth every 6 (six) hours as needed. (Patient not taking: Reported on 03/15/2015) 15 tablet 0   No current  facility-administered medications for this visit.     ALLERGIES: Hydrocodone-acetaminophen  Family History  Problem Relation Age of Onset  . Mental illness Mother   . Mental illness Maternal Grandmother   . Cancer Paternal Grandmother     skin cancer  . Thyroid disease Father   . Seizures Maternal Aunt     History   Social History  . Marital Status: Single    Spouse Name: N/A  . Number of Children: N/A  . Years of Education: N/A   Occupational History  . Not on file.   Social History Main Topics  . Smoking status: Never Smoker   . Smokeless tobacco: Not on file  . Alcohol Use: Yes     Comment: social  . Drug Use: Yes    Special: Marijuana     Comment: occ  . Sexual Activity:    Partners: Male    Birth Control/ Protection: Implant     Comment: Nexplanon inserted left arm 03-18-14   Other Topics Concern  . Not on file   Social History Narrative    ROS:  Pertinent items are noted in HPI.  PHYSICAL EXAMINATION:    BP 118/70 mmHg  Pulse 76  Temp(Src) 98.4 F (36.9 C) (Oral)  Resp 18  Ht 5' 7.5" (1.715 m)  Wt 147 lb (66.679 kg)  BMI 22.67 kg/m2  LMP 03/10/2015     General appearance: alert, cooperative and appears stated age  Pelvic: External genitalia:  no lesions              Urethra:  normal appearing urethra with no masses, tenderness or lesions              Bartholins and Skenes: normal                 Vagina: normal appearing vagina with normal color and discharge, no lesions              Cervix: dilated to 1 cm.  Tissue (products of conception) removed from external os.   Minimal bleeding.                    Bimanual Exam:  Uterus:  uterus is 6 week size, shape, consistency and nontender                                      Adnexa: normal adnexa in size, nontender and no masses                                         ASSESSMENT  Spontaneous abortion likely.  PLAN  To [facility] now for CBC, blood type and Rh, quantitative hCG, and  pelvic ultrasound.   Office is now closed.  Discussion of findings on exam and need to confirm the status of pregnancy - incomplete AB, heterotopic pregnancy.  NPO.  Discussed possible dilation and evacuation. An After Visit Summary was printed and given to the patient.  __25____ minutes face to face time of which over 50% was spent in counseling.

## 2015-03-15 NOTE — Telephone Encounter (Signed)
Spoke with patient. Advised of message as seen below from [facility]. Patient is agreeable. Appointment scheduled for today at [facility] with Dr.Silva. Appointment note placed for need of UPT and Hgb.  Routing to provider for final review. Patient agreeable to disposition. Will close encounter

## 2015-03-15 NOTE — MAU Provider Note (Signed)
History     CSN: [id number]  Arrival date and time: 03/15/15 4098   First Provider Initiated Contact with Patient 03/15/15 1901      No chief complaint on file.  HPI Comments: Colleen Diaz is a 24 y.o. G1P0010 at Unknown who presents today from the office because of a probable miscarriage. She states that she thinks she may have gotten in pregnant around Valentine's Day, however she was ignoring those symptoms. Last week on 03/10/15 she started to have bleeding. She contributed this to her period. She states that she has been passing clots, and last night she felt like she had to "push something out". On exam today in the office it was noted that there was tissue at the cervical os. She states that she took Plan B February 15th.   Vaginal Bleeding This is a new problem. The current episode started in the past 7 days. The problem occurs daily. The problem has been gradually worsening. The patient is experiencing no pain. She is pregnant. Associated symptoms include abdominal pain (pain stopped this morning. She felt like she had to "push" something out last night ). Pertinent negatives include no constipation, diarrhea, dysuria, fever, frequency, nausea, urgency or vomiting. Nothing aggravates the symptoms. She has tried nothing for the symptoms. She is sexually active. She uses oral contraceptives for contraception.     Past Medical History  Diagnosis Date  . Anxiety   . Depression   . Anemia   . STD (sexually transmitted disease) 08/2011    Tx'd for Chlamydia  . Allergic rhinitis     followed by Dr Fredderick Phenix    Past Surgical History  Procedure Laterality Date  . Dilation and curettage of uterus  09-04-13    TAB    Family History  Problem Relation Age of Onset  . Mental illness Mother   . Mental illness Maternal Grandmother   . Cancer Paternal Grandmother     skin cancer  . Thyroid disease Father   . Seizures Maternal Aunt     History  Substance Use Topics  . Smoking  status: Never Smoker   . Smokeless tobacco: Not on file  . Alcohol Use: Yes     Comment: social    Allergies:  Allergies  Allergen Reactions  . Hydrocodone-Acetaminophen Nausea And Vomiting    Prescriptions prior to admission  Medication Sig Dispense Refill Last Dose  . acetaminophen-codeine (TYLENOL #3) 300-30 MG per tablet Take 1-2 tablets by mouth every 6 (six) hours as needed. (Patient not taking: Reported on 03/15/2015) 15 tablet 0 Not Taking  . ALPRAZolam (XANAX) 0.25 MG tablet Take 1 tablet (0.25 mg total) by mouth 3 (three) times daily as needed for anxiety. 6 tablet 0 Taking  . cetirizine-pseudoephedrine (ZYRTEC-D) 5-120 MG per tablet Take 1 tablet by mouth daily as needed for allergies.   Taking  . citalopram (CELEXA) 20 MG tablet Take 10 mg by mouth daily.   Taking  . Levonorgestrel-Ethinyl Estradiol (AMETHIA,CAMRESE) 0.15-0.03 &0.01 MG tablet Take 1 tablet by mouth daily.   Taking    Review of Systems  Constitutional: Negative for fever.  Gastrointestinal: Positive for abdominal pain (pain stopped this morning. She felt like she had to "push" something out last night ). Negative for nausea, vomiting, diarrhea and constipation.  Genitourinary: Positive for vaginal bleeding. Negative for dysuria, urgency and frequency.   Physical Exam   Last menstrual period 03/10/2015.  Physical Exam  Nursing note and vitals reviewed. Constitutional: She is oriented to  person, place, and time. She appears well-developed and well-nourished. No distress.  Cardiovascular: Normal rate.   Respiratory: Effort normal.  GI: Soft. There is no tenderness.  Neurological: She is alert and oriented to person, place, and time.  Skin: Skin is warm and dry.  Psychiatric: She has a normal mood and affect.    MAU Course  Procedures  MDM CBC UA HCG ABO/RH HCG US OB complete   2000: Care turned over to [facility], NP; ultrasound results pending  [name] 03/15/2015 7:32 PM   Assessment and Plan

## 2015-03-15 NOTE — Telephone Encounter (Signed)
Spoke with patient. Patient states that she has been having "Severe clotting for five days. It is not like bleeding it has been all huge clots until today. I started bleeding and it is heavy." Patient is changing pad/tampon every hour due to bleeding through. Has taken midol and pain medication over the weekend without relief. Pain is abdominal and is constant. Currently a 6/10. Patient is currently taking Seasonique for birth control. Is in second pack. "I do not know if I am having a miscarriage or not. I had intercourse during ovulation. My boobs have been getting larger and I do not know if that is from the birth control or not. I have been pregnant before and it felt the same." Patient missed one pill last month. Has not taken UPT.Advised patient will need to be seen in office for evaluation. Patient is agreeable. Advised will speak with provider and return call to schedule appointment. Patient is agreeable.  Dr.Silva, first available appointment today is 4:15pm. Upon return to the office may we discuss?

## 2015-03-15 NOTE — Telephone Encounter (Signed)
I am OK with an appointment today.  I would like to have the patient come at 4:00.  She will need a UPT in the office and also have a Hgb.

## 2015-03-16 ENCOUNTER — Encounter: Payer: Self-pay | Admitting: Obstetrics and Gynecology

## 2015-03-16 LAB — ABO/RH: ABO/RH(D): O POS

## 2015-03-17 ENCOUNTER — Ambulatory Visit (INDEPENDENT_AMBULATORY_CARE_PROVIDER_SITE_OTHER): Payer: BLUE CROSS/BLUE SHIELD | Admitting: Obstetrics and Gynecology

## 2015-03-17 ENCOUNTER — Other Ambulatory Visit: Payer: BLUE CROSS/BLUE SHIELD | Admitting: Obstetrics and Gynecology

## 2015-03-17 ENCOUNTER — Encounter: Payer: Self-pay | Admitting: Obstetrics and Gynecology

## 2015-03-17 ENCOUNTER — Telehealth: Payer: Self-pay | Admitting: Obstetrics and Gynecology

## 2015-03-17 VITALS — BP 102/60 | HR 64 | Temp 98.1°F | Resp 16 | Ht 67.5 in | Wt 147.0 lb

## 2015-03-17 DIAGNOSIS — O039 Complete or unspecified spontaneous abortion without complication: Secondary | ICD-10-CM | POA: Diagnosis not present

## 2015-03-17 LAB — HCG, QUANTITATIVE, PREGNANCY: hCG, Beta Chain, Quant, S: 110 m[IU]/mL

## 2015-03-17 NOTE — Progress Notes (Signed)
GYNECOLOGY  VISIT   HPI: 24 y.o.   Single  Caucasian  female   G1P0010 with Patient's last menstrual period was 03/10/2015.   here for   Follow up SAB Quant 277 in ER on 03/15/15.  BT - O +.  Ultrasound there showed slight thickening of the LUS - 9 mm - with no blood flow to the area.  Small right adnexal cyst consistent with possible paraovarian cyst measuring 16 mm.  Area not suspicious for ectopic but cannot be entirely excluded (this only on the final ultrasound report and not the verbal from the radiologist at the time of the ER visit.)  Cramping and continued bleeding since office visit and ER visit on 03/15/15.  Passed a little more tissue.   GYNECOLOGIC HISTORY: Patient's last menstrual period was 03/10/2015. Contraception:   None at the moment  Menopausal hormone therapy: none        OB History    Gravida Para Term Preterm AB TAB SAB Ectopic Multiple Living   1    1 1              There are no active problems to display for this patient.   Past Medical History  Diagnosis Date  . Anxiety   . Depression   . Anemia   . STD (sexually transmitted disease) 08/2011    Tx'd for Chlamydia  . Allergic rhinitis     followed by Dr Fredderick Phenix    Past Surgical History  Procedure Laterality Date  . Dilation and curettage of uterus  09-04-13    TAB    Current Outpatient Prescriptions  Medication Sig Dispense Refill  . acetaminophen-codeine (TYLENOL #3) 300-30 MG per tablet Take 1-2 tablets by mouth every 6 (six) hours as needed. 15 tablet 0  . ALPRAZolam (XANAX) 0.25 MG tablet Take 1 tablet (0.25 mg total) by mouth 3 (three) times daily as needed for anxiety. 6 tablet 0  . cetirizine-pseudoephedrine (ZYRTEC-D) 5-120 MG per tablet Take 1 tablet by mouth daily as needed for allergies.    . citalopram (CELEXA) 20 MG tablet Take 10 mg by mouth daily.    . Levonorgestrel-Ethinyl Estradiol (AMETHIA,CAMRESE) 0.15-0.03 &0.01 MG tablet Take 1 tablet by mouth daily.     No current  facility-administered medications for this visit.     ALLERGIES: Hydrocodone-acetaminophen  Family History  Problem Relation Age of Onset  . Mental illness Mother   . Mental illness Maternal Grandmother   . Cancer Paternal Grandmother     skin cancer  . Thyroid disease Father   . Seizures Maternal Aunt     History   Social History  . Marital Status: Single    Spouse Name: N/A  . Number of Children: N/A  . Years of Education: N/A   Occupational History  . Not on file.   Social History Main Topics  . Smoking status: Never Smoker   . Smokeless tobacco: Never Used  . Alcohol Use: Yes     Comment: social  . Drug Use: Yes    Special: Marijuana     Comment: occ  . Sexual Activity:    Partners: Male    Birth Control/ Protection: Pill     Comment: Nexplanon inserted left arm 03-18-14   Other Topics Concern  . Not on file   Social History Narrative    ROS:  Pertinent items are noted in HPI.  PHYSICAL EXAMINATION:    BP 102/60 mmHg  Pulse 64  Resp 16  Ht 5'  7.5" (1.715 m)  Wt 147 lb (66.679 kg)  BMI 22.67 kg/m2  LMP 03/10/2015     General appearance: alert, cooperative and appears stated age   Pelvic: External genitalia:  no lesions              Urethra:  normal appearing urethra with no masses, tenderness or lesions              Bartholins and Skenes: normal                 Vagina: normal appearing vagina with normal color and discharge, no lesions              Cervix: normal appearance.  Small amount of red blood in the vagina. No tissue at the cervix.                   Bimanual Exam:  Uterus:  uterus is normal size, shape, consistency and nontender                                      Adnexa: normal adnexa in size, nontender and no masses                                        ASSESSMENT  SAB.  PLAN  Review of SAB. Will do STAT quantitative hCG now. Patient understands she will need to be followed until the hCG is a negative number (nonpregnant  value). Return depends on the lab result, but will need to be in at least one week, sooner as needed.   An After Visit Summary was printed and given to the patient.  _15_____ minutes face to face time of which over 50% was spent in counseling.

## 2015-03-17 NOTE — Progress Notes (Unsigned)
GYNECOLOGY  VISIT   HPI: 24 y.o.   Single  Caucasian  female   G1P0010 with Patient's last menstrual period was 03/10/2015.   here for Follow up SAB    GYNECOLOGIC HISTORY: Patient's last menstrual period was 03/10/2015. Contraception:  Camrese  Menopausal hormone therapy: None        OB History    Gravida Para Term Preterm AB TAB SAB Ectopic Multiple Living   1    1 1              There are no active problems to display for this patient.   Past Medical History  Diagnosis Date  . Anxiety   . Depression   . Anemia   . STD (sexually transmitted disease) 08/2011    Tx'd for Chlamydia  . Allergic rhinitis     followed by Dr Fredderick Phenix    Past Surgical History  Procedure Laterality Date  . Dilation and curettage of uterus  09-04-13    TAB    Current Outpatient Prescriptions  Medication Sig Dispense Refill  . acetaminophen-codeine (TYLENOL #3) 300-30 MG per tablet Take 1-2 tablets by mouth every 6 (six) hours as needed. 15 tablet 0  . ALPRAZolam (XANAX) 0.25 MG tablet Take 1 tablet (0.25 mg total) by mouth 3 (three) times daily as needed for anxiety. 6 tablet 0  . cetirizine-pseudoephedrine (ZYRTEC-D) 5-120 MG per tablet Take 1 tablet by mouth daily as needed for allergies.    . citalopram (CELEXA) 20 MG tablet Take 10 mg by mouth daily.    . Levonorgestrel-Ethinyl Estradiol (AMETHIA,CAMRESE) 0.15-0.03 &0.01 MG tablet Take 1 tablet by mouth daily.     No current facility-administered medications for this visit.     ALLERGIES: Hydrocodone-acetaminophen  Family History  Problem Relation Age of Onset  . Mental illness Mother   . Mental illness Maternal Grandmother   . Cancer Paternal Grandmother     skin cancer  . Thyroid disease Father   . Seizures Maternal Aunt     History   Social History  . Marital Status: Single    Spouse Name: N/A  . Number of Children: N/A  . Years of Education: N/A   Occupational History  . Not on file.   Social History Main Topics   . Smoking status: Never Smoker   . Smokeless tobacco: Never Used  . Alcohol Use: Yes     Comment: social  . Drug Use: Yes    Special: Marijuana     Comment: occ  . Sexual Activity:    Partners: Male    Birth Control/ Protection: Pill     Comment: Nexplanon inserted left arm 03-18-14   Other Topics Concern  . Not on file   Social History Narrative    ROS:  Pertinent items are noted in HPI.  PHYSICAL EXAMINATION:    BP 102/60 mmHg  Pulse 64  Temp(Src) 98.1 F (36.7 C) (Oral)  Resp 16  Ht 5' 7.5" (1.715 m)  Wt 147 lb (66.679 kg)  BMI 22.67 kg/m2  LMP 03/10/2015     General appearance: alert, cooperative and appears stated age Lungs: clear to auscultation bilaterally Heart: regular rate and rhythm Abdomen: soft, non-tender; no masses,  no organomegaly No abnormal inguinal nodes palpated  Pelvic: External genitalia:  no lesions              Urethra:  normal appearing urethra with no masses, tenderness or lesions  Bartholins and Skenes: normal                 Vagina: normal appearing vagina with normal color and discharge, no lesions              Cervix: normal appearance                   Bimanual Exam:  Uterus:  uterus is normal size, shape, consistency and nontender                                      Adnexa: normal adnexa in size, nontender and no masses                                      Rectovaginal:  {yes no:314532}                                                              Confirms                                      Anus:  normal sphincter tone, no lesions  ASSESSMENT     PLAN    Counseled  See labs:  {yes no:314532} Return    An After Visit Summary was printed and given to the patient.  ______ minutes face to face time of which over 50% was spent in counseling.

## 2015-03-17 NOTE — Telephone Encounter (Signed)
Spoke with patient. Advised of results as seen below. Patient is agreeable. Recheck appointment scheduled for 4/5 at 9:05am. Patient is agreeable to date and time.Order placed for lab.   Routing to provider for final review. Patient agreeable to disposition. Will close encounter

## 2015-03-17 NOTE — Telephone Encounter (Signed)
Patient is calling to get her lab results.

## 2015-03-17 NOTE — Telephone Encounter (Signed)
Notes Recorded by Nunzio Cobbs, MD on 03/17/2015 at 2:33 PM Please report value to patient.  HCG is now 110.  This is going down quickly and indicates that indeed this represents a miscarriage as we thought.  I will have the patient return for a lab visit only in one week for another quant beta hCG.  That will not need to be a stat lab.

## 2015-03-19 ENCOUNTER — Telehealth: Payer: Self-pay | Admitting: Emergency Medicine

## 2015-03-19 ENCOUNTER — Other Ambulatory Visit (INDEPENDENT_AMBULATORY_CARE_PROVIDER_SITE_OTHER): Payer: BLUE CROSS/BLUE SHIELD

## 2015-03-19 ENCOUNTER — Other Ambulatory Visit: Payer: Self-pay | Admitting: Obstetrics and Gynecology

## 2015-03-19 DIAGNOSIS — O039 Complete or unspecified spontaneous abortion without complication: Secondary | ICD-10-CM

## 2015-03-19 LAB — HCG, QUANTITATIVE, PREGNANCY: HCG, BETA CHAIN, QUANT, S: 50 m[IU]/mL

## 2015-03-19 LAB — HEMOGLOBIN, FINGERSTICK: Hemoglobin, fingerstick: 11.7 g/dL — ABNORMAL LOW (ref 12.0–16.0)

## 2015-03-19 NOTE — Telephone Encounter (Signed)
-----   Message from Nunzio Cobbs, MD sent at 03/19/2015  1:07 PM EDT ----- Please report quantitative beta hCG to patient. Level is now 50, which is down from the previous level of 110, two days ago. This continues to be reassuring that the number are going down consistently as we expected.  We still need to follow then to a nonpregnant level, which is under 5.   I am recommending that the patient return on Monday, April 4 in the morning for her next blood draw.   We will do the same routine of an am lab visit and process as a STAT lab.  Her hemoglobin was 11.7, which represents slight anemia.  I recommend an iron supplement such as iron sulfate 325 mg daily at this time.  She has ectopic precautions already.

## 2015-03-19 NOTE — Telephone Encounter (Signed)
Spoke with patient and message from Dr. Quincy Simmonds given. Patient agreeable to plan. Appointment for 03/22/15 at 0840 scheduled and instructions given regarding iron.  Patient verbalized understanding and will call back prn.  Routing to provider for final review. Patient agreeable to disposition. Will close encounter

## 2015-03-22 ENCOUNTER — Other Ambulatory Visit (INDEPENDENT_AMBULATORY_CARE_PROVIDER_SITE_OTHER): Payer: BLUE CROSS/BLUE SHIELD

## 2015-03-22 ENCOUNTER — Telehealth: Payer: Self-pay

## 2015-03-22 DIAGNOSIS — O039 Complete or unspecified spontaneous abortion without complication: Secondary | ICD-10-CM

## 2015-03-22 LAB — HCG, QUANTITATIVE, PREGNANCY: HCG, BETA CHAIN, QUANT, S: 19 m[IU]/mL

## 2015-03-22 NOTE — Telephone Encounter (Signed)
-----   Message from Nunzio Cobbs, MD sent at 03/22/2015  2:50 PM EDT ----- Please contact patient with results.  Level of hCG is now 19.  Needs to be under 5 to stop blood work.  Return for repeat hCG on 03/26/15 in am.  Hopefully that will be the last one!  Cc- Marisa Sprinkles

## 2015-03-22 NOTE — Telephone Encounter (Signed)
Left message to call Kaitlyn at [phone]. 

## 2015-03-23 ENCOUNTER — Other Ambulatory Visit: Payer: BLUE CROSS/BLUE SHIELD

## 2015-03-23 NOTE — Telephone Encounter (Signed)
Spoke with patient. Advised of results as seen below. Patient is agreeable and verbalizes understanding. Follow up lab appointment scheduled for 4/8 at 10:30am. Patient is agreeable to date and time. "I have been extremely fatigued. I know my anemia came back but it there anything I can do?" Advised anemia can cause fatigue. Needs to make sure she is taking iron supplement 325mg  daily per lab note from [place]. Patient is agreeable. Started taking iron yesterday.  Routing to provider for final review. Patient agreeable to disposition. Will close encounter

## 2015-03-23 NOTE — Telephone Encounter (Signed)
Pt returned call for lab results.

## 2015-03-26 ENCOUNTER — Other Ambulatory Visit (INDEPENDENT_AMBULATORY_CARE_PROVIDER_SITE_OTHER): Payer: BLUE CROSS/BLUE SHIELD

## 2015-03-26 ENCOUNTER — Other Ambulatory Visit: Payer: Self-pay

## 2015-03-26 ENCOUNTER — Telehealth: Payer: Self-pay

## 2015-03-26 DIAGNOSIS — O039 Complete or unspecified spontaneous abortion without complication: Secondary | ICD-10-CM

## 2015-03-26 LAB — IRON: Iron: 61 ug/dL (ref 42–145)

## 2015-03-26 NOTE — Telephone Encounter (Signed)
Opened in error

## 2015-03-27 LAB — HCG, QUANTITATIVE, PREGNANCY: hCG, Beta Chain, Quant, S: 5.5 m[IU]/mL

## 2015-03-29 ENCOUNTER — Telehealth: Payer: Self-pay | Admitting: Obstetrics and Gynecology

## 2015-03-29 DIAGNOSIS — O039 Complete or unspecified spontaneous abortion without complication: Secondary | ICD-10-CM

## 2015-03-29 NOTE — Telephone Encounter (Signed)
Spoke with patient. Advised of results as seen below from [place]. Patient is agreeable and verbalizes understanding. Lab appointment scheduled for 4/15 at 10:30am. Patient is agreeable to date and time.  Notes Recorded by Nunzio Cobbs, MD on 03/28/2015 at 6:10 PM Please inform patient of her results. She needs to have one additional hCG this week. Any day is fine, but I need her to make this appointment with [place].   Her iron level is normal.  Cc- Marisa Sprinkles  Routing to provider for final review. Patient agreeable to disposition. Will close encounter

## 2015-03-29 NOTE — Telephone Encounter (Signed)
Pt calling for lab results.

## 2015-04-02 ENCOUNTER — Other Ambulatory Visit: Payer: BLUE CROSS/BLUE SHIELD

## 2015-04-02 ENCOUNTER — Other Ambulatory Visit (INDEPENDENT_AMBULATORY_CARE_PROVIDER_SITE_OTHER): Payer: BLUE CROSS/BLUE SHIELD

## 2015-04-02 DIAGNOSIS — O039 Complete or unspecified spontaneous abortion without complication: Secondary | ICD-10-CM

## 2015-04-03 LAB — HCG, QUANTITATIVE, PREGNANCY

## 2015-04-06 ENCOUNTER — Telehealth: Payer: Self-pay | Admitting: Obstetrics and Gynecology

## 2015-04-06 NOTE — Telephone Encounter (Signed)
Spoke with patient. Advised of message as seen below from [place]. Patient is agreeable and would like to restart on OCP at this time.  Notes Recorded by Nunzio Cobbs, MD on 04/05/2015 at 12:05 PM Please let patient know that her quant beta hCG is now negative.  She is officially without any pregnancy hormone in her body.   She can restart her birth control pills and use back up protection for one full month.  I am happy to see her in the office to discuss other contraceptive options if she would like.  Routing to provider for final review. Patient agreeable to disposition. Will close encounter

## 2015-04-06 NOTE — Telephone Encounter (Signed)
Patient is asking to talk with Dr.Silva's nurse regarding her labs. Last seen 04/02/15/

## 2016-06-02 IMAGING — US US OB COMP LESS 14 WK
1 series · 13 of 28 positions shown · non-contrast
Comparison: None.

CLINICAL DATA: Pregnant patient with vaginal bleeding, passage of
clots, cramping. Beta HCG 277.

EXAM:
OBSTETRIC <14 WK US AND TRANSVAGINAL OB US
TECHNIQUE: Both transabdominal and transvaginal ultrasound examinations were
performed for complete evaluation of the gestation as well as the
maternal uterus, adnexal regions, and pelvic cul-de-sac.
Transvaginal technique was performed to assess early pregnancy.

[Series 1: us ob comp less 14 wks · 13 of 36 slices shown]
[im 2/36]
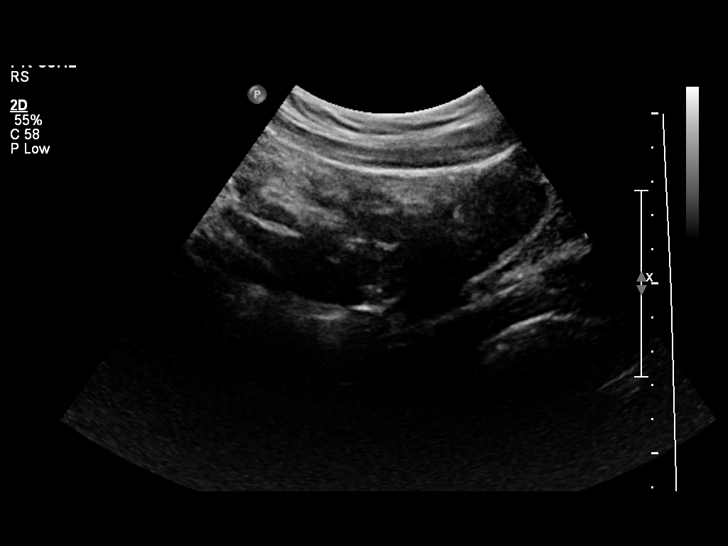
[im 4/36]
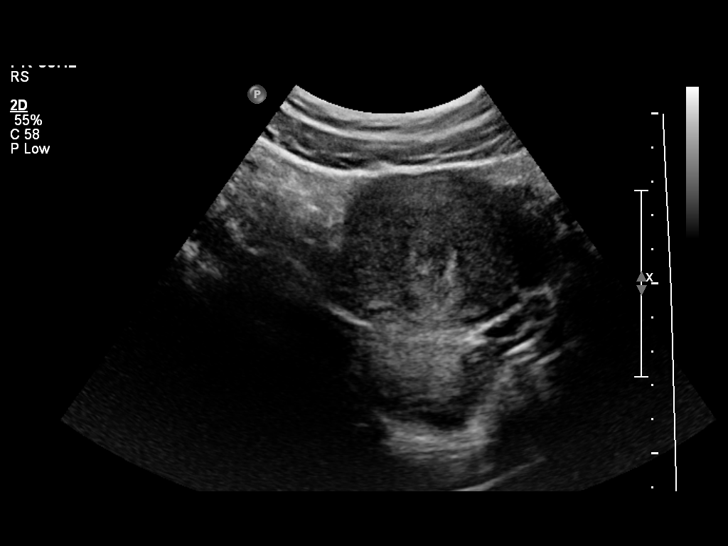
[im 7/36]
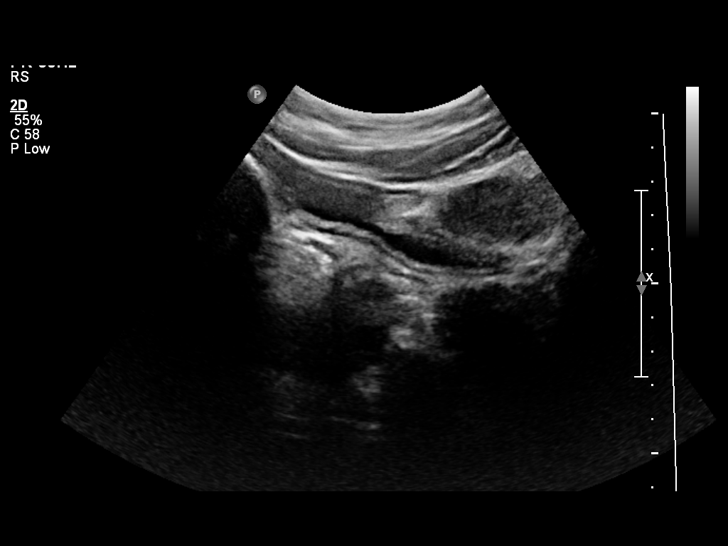
[im 10/36]
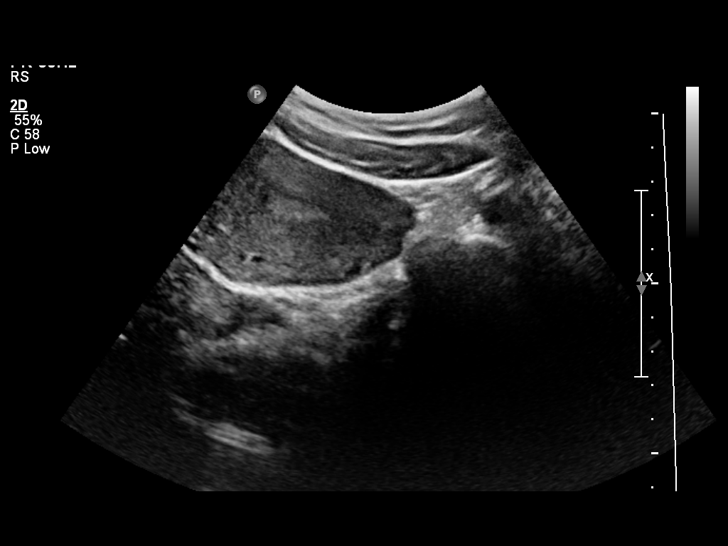
[im 12/36]
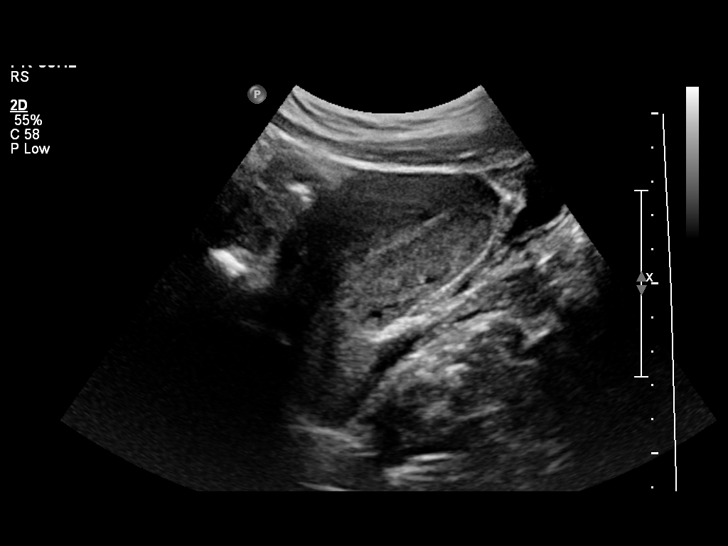
[im 15/36]
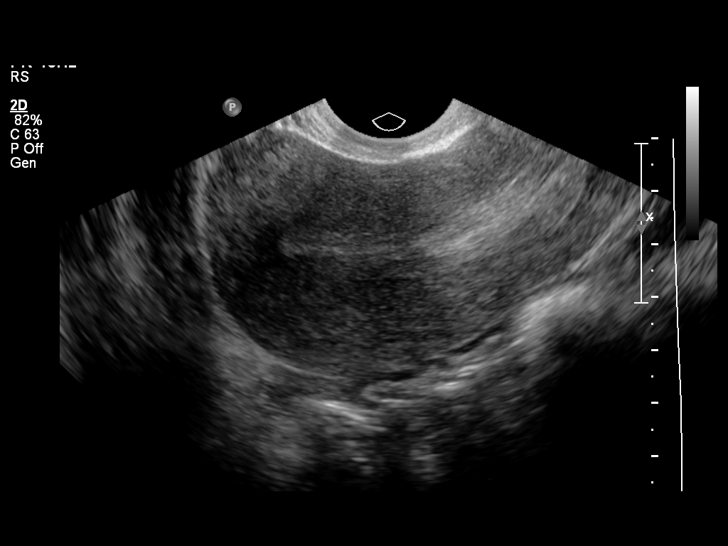
[im 19/36]
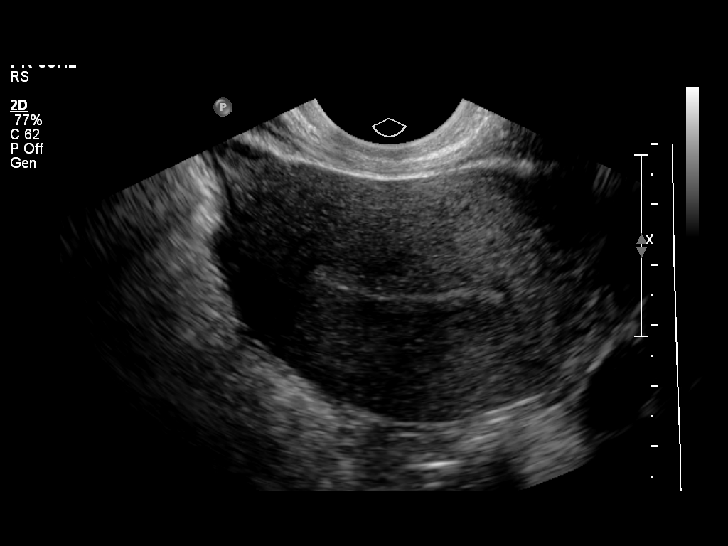
[im 21/36]
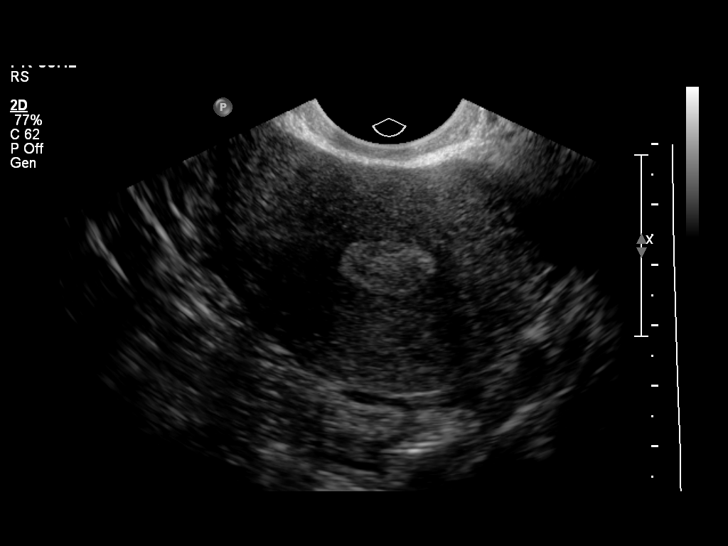
[im 24/36]
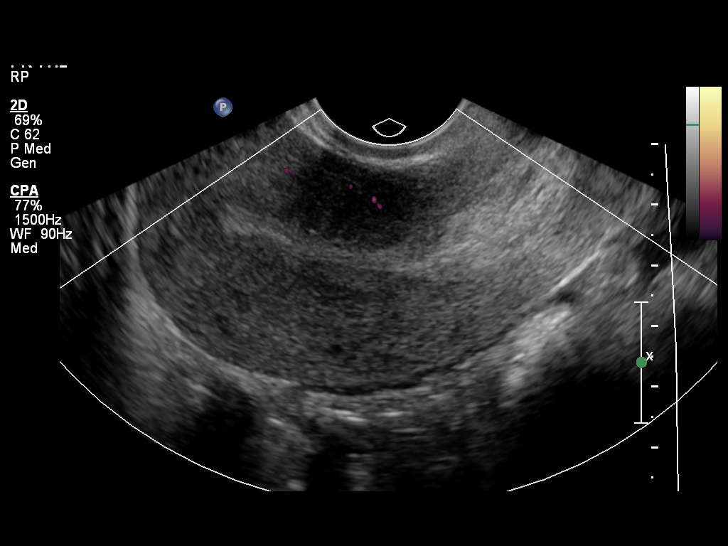
[im 26/36]
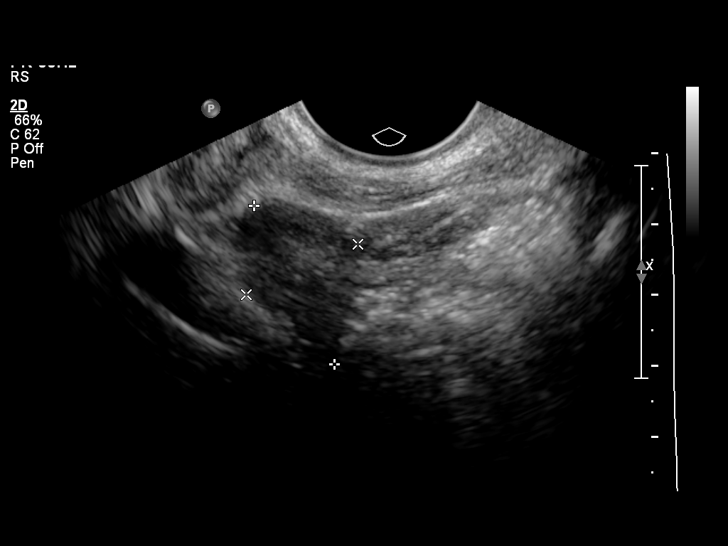
[im 29/36]
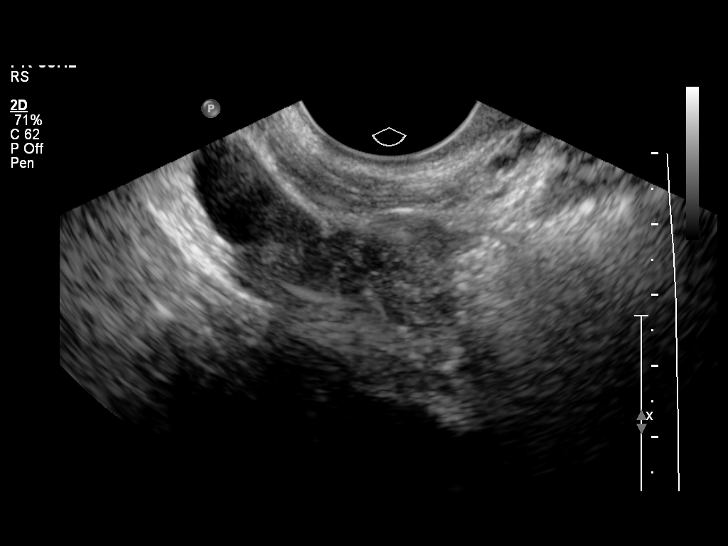
[im 32/36]
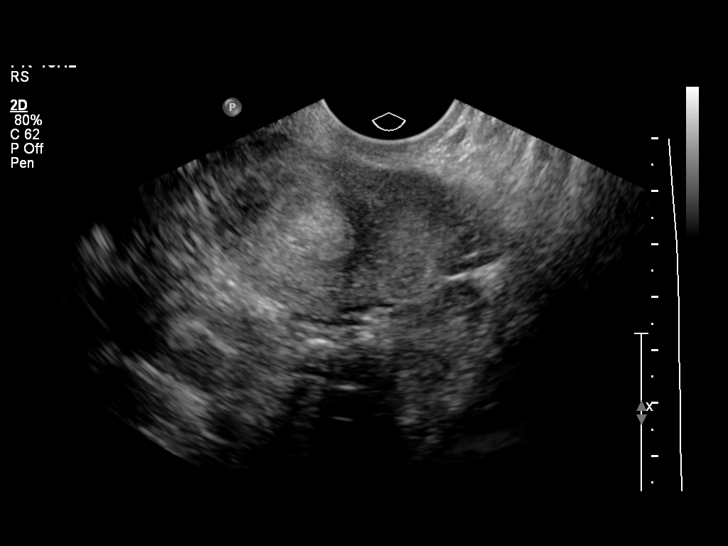
[im 34/36]
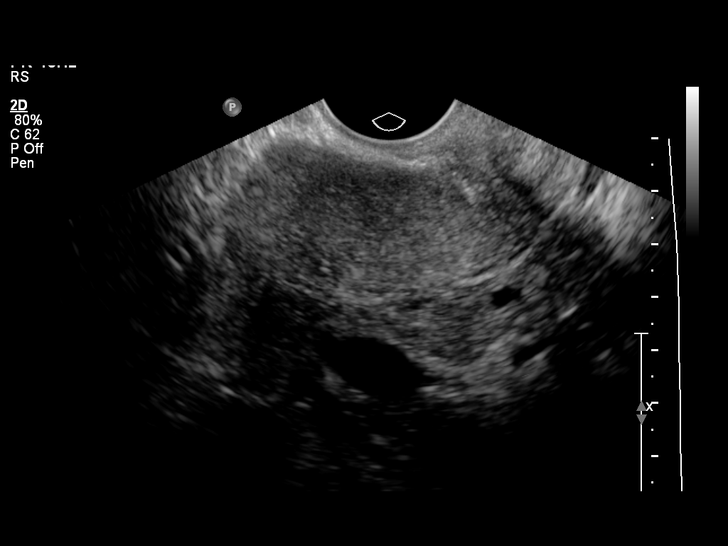

[13 of 28 positions shown; findings below may reference images not displayed]

FINDINGS: Intrauterine gestational sac: Not visualized.

Yolk sac:  Not visualized.

Embryo:  Not visualized.

Cardiac Activity: None

Maternal uterus/adnexae: There is thickening of the endometrium in
the lower uterine segment measuring 9 mm. No endometrial fluid. The
left ovary is normal with normal blood flow. There is question of a
complex cystic region adjacent to the right ovary measuring
approximately 16 mm, best appreciated on the cine clip. Right ovary
is otherwise normal. No significant pelvic free fluid.
IMPRESSION: 1. No intrauterine pregnancy. Thickening of the endometrium in the
lower uterine segment of 9 mm.
2. No findings suspicious for ectopic pregnancy. There is a question
of complex cystic region adjacent to the right ovary, suspect
paraovarian cyst. Ectopic pregnancy is not entirely excluded but
felt less likely. Continued trending of beta HCG is recommended.

Findings were discussed with the referring clinician at the time of
the exam.

## 2016-08-02 ENCOUNTER — Encounter: Payer: Self-pay | Admitting: Obstetrics and Gynecology

## 2016-08-02 ENCOUNTER — Other Ambulatory Visit: Payer: Self-pay | Admitting: *Deleted

## 2016-08-02 ENCOUNTER — Ambulatory Visit (INDEPENDENT_AMBULATORY_CARE_PROVIDER_SITE_OTHER): Payer: BLUE CROSS/BLUE SHIELD | Admitting: Obstetrics and Gynecology

## 2016-08-02 VITALS — BP 116/68 | HR 88 | Resp 16 | Ht 67.5 in | Wt 136.0 lb

## 2016-08-02 DIAGNOSIS — Z113 Encounter for screening for infections with a predominantly sexual mode of transmission: Secondary | ICD-10-CM | POA: Diagnosis not present

## 2016-08-02 DIAGNOSIS — N6320 Unspecified lump in the left breast, unspecified quadrant: Secondary | ICD-10-CM

## 2016-08-02 DIAGNOSIS — N63 Unspecified lump in unspecified breast: Secondary | ICD-10-CM

## 2016-08-02 DIAGNOSIS — Z01419 Encounter for gynecological examination (general) (routine) without abnormal findings: Secondary | ICD-10-CM | POA: Diagnosis not present

## 2016-08-02 MED ORDER — LEVONORGEST-ETH ESTRAD 91-DAY 0.15-0.03 &0.01 MG PO TABS: 1.0000 | ORAL_TABLET | Freq: Every day | ORAL | 3 refills | Status: DC

## 2016-08-02 NOTE — Patient Instructions (Signed)
Health Maintenance, Female Adopting a healthy lifestyle and getting preventive care can go a long way to promote health and wellness. Talk with your health care provider about what schedule of regular examinations is right for you. This is a good chance for you to check in with your provider about disease prevention and staying healthy. In between checkups, there are plenty of things you can do on your own. Experts have done a lot of research about which lifestyle changes and preventive measures are most likely to keep you healthy. Ask your health care provider for more information. WEIGHT AND DIET  Eat a healthy diet  Be sure to include plenty of vegetables, fruits, low-fat dairy products, and lean protein.  Do not eat a lot of foods high in solid fats, added sugars, or salt.  Get regular exercise. This is one of the most important things you can do for your health.  Most adults should exercise for at least 150 minutes each week. The exercise should increase your heart rate and make you sweat (moderate-intensity exercise).  Most adults should also do strengthening exercises at least twice a week. This is in addition to the moderate-intensity exercise.  Maintain a healthy weight  Body mass index (BMI) is a measurement that can be used to identify possible weight problems. It estimates body fat based on height and weight. Your health care provider can help determine your BMI and help you achieve or maintain a healthy weight.  For females 20 years of age and older:   A BMI below 18.5 is considered underweight.  A BMI of 18.5 to 24.9 is normal.  A BMI of 25 to 29.9 is considered overweight.  A BMI of 30 and above is considered obese.  Watch levels of cholesterol and blood lipids  You should start having your blood tested for lipids and cholesterol at 24 years of age, then have this test every 5 years.  You may need to have your cholesterol levels checked more often if:  Your lipid  or cholesterol levels are high.  You are older than 24 years of age.  You are at high risk for heart disease.  CANCER SCREENING   Lung Cancer  Lung cancer screening is recommended for adults 55-80 years old who are at high risk for lung cancer because of a history of smoking.  A yearly low-dose CT scan of the lungs is recommended for people who:  Currently smoke.  Have quit within the past 15 years.  Have at least a 30-pack-year history of smoking. A pack year is smoking an average of one pack of cigarettes a day for 1 year.  Yearly screening should continue until it has been 15 years since you quit.  Yearly screening should stop if you develop a health problem that would prevent you from having lung cancer treatment.  Breast Cancer  Practice breast self-awareness. This means understanding how your breasts normally appear and feel.  It also means doing regular breast self-exams. Let your health care provider know about any changes, no matter how small.  If you are in your 20s or 30s, you should have a clinical breast exam (CBE) by a health care provider every 1-3 years as part of a regular health exam.  If you are 40 or older, have a CBE every year. Also consider having a breast X-ray (mammogram) every year.  If you have a family history of breast cancer, talk to your health care provider about genetic screening.  If you   are at high risk for breast cancer, talk to your health care provider about having an MRI and a mammogram every year.  Breast cancer gene (BRCA) assessment is recommended for women who have family members with BRCA-related cancers. BRCA-related cancers include:  Breast.  Ovarian.  Tubal.  Peritoneal cancers.  Results of the assessment will determine the need for genetic counseling and BRCA1 and BRCA2 testing. Cervical Cancer Your health care provider may recommend that you be screened regularly for cancer of the pelvic organs (ovaries, uterus, and  vagina). This screening involves a pelvic examination, including checking for microscopic changes to the surface of your cervix (Pap test). You may be encouraged to have this screening done every 3 years, beginning at age 21.  For women ages 30-65, health care providers may recommend pelvic exams and Pap testing every 3 years, or they may recommend the Pap and pelvic exam, combined with testing for human papilloma virus (HPV), every 5 years. Some types of HPV increase your risk of cervical cancer. Testing for HPV may also be done on women of any age with unclear Pap test results.  Other health care providers may not recommend any screening for nonpregnant women who are considered low risk for pelvic cancer and who do not have symptoms. Ask your health care provider if a screening pelvic exam is right for you.  If you have had past treatment for cervical cancer or a condition that could lead to cancer, you need Pap tests and screening for cancer for at least 20 years after your treatment. If Pap tests have been discontinued, your risk factors (such as having a new sexual partner) need to be reassessed to determine if screening should resume. Some women have medical problems that increase the chance of getting cervical cancer. In these cases, your health care provider may recommend more frequent screening and Pap tests. Colorectal Cancer  This type of cancer can be detected and often prevented.  Routine colorectal cancer screening usually begins at 24 years of age and continues through 24 years of age.  Your health care provider may recommend screening at an earlier age if you have risk factors for colon cancer.  Your health care provider may also recommend using home test kits to check for hidden blood in the stool.  A small camera at the end of a tube can be used to examine your colon directly (sigmoidoscopy or colonoscopy). This is done to check for the earliest forms of colorectal  cancer.  Routine screening usually begins at age 50.  Direct examination of the colon should be repeated every 5-10 years through 24 years of age. However, you may need to be screened more often if early forms of precancerous polyps or small growths are found. Skin Cancer  Check your skin from head to toe regularly.  Tell your health care provider about any new moles or changes in moles, especially if there is a change in a mole's shape or color.  Also tell your health care provider if you have a mole that is larger than the size of a pencil eraser.  Always use sunscreen. Apply sunscreen liberally and repeatedly throughout the day.  Protect yourself by wearing long sleeves, pants, a wide-brimmed hat, and sunglasses whenever you are outside. HEART DISEASE, DIABETES, AND HIGH BLOOD PRESSURE   High blood pressure causes heart disease and increases the risk of stroke. High blood pressure is more likely to develop in:  People who have blood pressure in the high end   of the normal range (130-139/85-89 mm Hg).  People who are overweight or obese.  People who are African American.  If you are 38-23 years of age, have your blood pressure checked every 3-5 years. If you are 61 years of age or older, have your blood pressure checked every year. You should have your blood pressure measured twice--once when you are at a hospital or clinic, and once when you are not at a hospital or clinic. Record the average of the two measurements. To check your blood pressure when you are not at a hospital or clinic, you can use:  An automated blood pressure machine at a pharmacy.  A home blood pressure monitor.  If you are between 45 years and 39 years old, ask your health care provider if you should take aspirin to prevent strokes.  Have regular diabetes screenings. This involves taking a blood sample to check your fasting blood sugar level.  If you are at a normal weight and have a low risk for diabetes,  have this test once every three years after 24 years of age.  If you are overweight and have a high risk for diabetes, consider being tested at a younger age or more often. PREVENTING INFECTION  Hepatitis B  If you have a higher risk for hepatitis B, you should be screened for this virus. You are considered at high risk for hepatitis B if:  You were born in a country where hepatitis B is common. Ask your health care provider which countries are considered high risk.  Your parents were born in a high-risk country, and you have not been immunized against hepatitis B (hepatitis B vaccine).  You have HIV or AIDS.  You use needles to inject street drugs.  You live with someone who has hepatitis B.  You have had sex with someone who has hepatitis B.  You get hemodialysis treatment.  You take certain medicines for conditions, including cancer, organ transplantation, and autoimmune conditions. Hepatitis C  Blood testing is recommended for:  Everyone born from [birth year] through 1964.  Anyone with known risk factors for hepatitis C. Sexually transmitted infections (STIs)  You should be screened for sexually transmitted infections (STIs) including gonorrhea and chlamydia if:  You are sexually active and are younger than 24 years of age.  You are older than 24 years of age and your health care provider tells you that you are at risk for this type of infection.  Your sexual activity has changed since you were last screened and you are at an increased risk for chlamydia or gonorrhea. Ask your health care provider if you are at risk.  If you do not have HIV, but are at risk, it may be recommended that you take a prescription medicine daily to prevent HIV infection. This is called pre-exposure prophylaxis (PrEP). You are considered at risk if:  You are sexually active and do not regularly use condoms or know the HIV status of your partner(s).  You take drugs by injection.  You are sexually  active with a partner who has HIV. Talk with your health care provider about whether you are at high risk of being infected with HIV. If you choose to begin PrEP, you should first be tested for HIV. You should then be tested every 3 months for as long as you are taking PrEP.  PREGNANCY   If you are premenopausal and you may become pregnant, ask your health care provider about preconception counseling.  If you may  become pregnant, take 400 to 800 micrograms (mcg) of folic acid every day.  If you want to prevent pregnancy, talk to your health care provider about birth control (contraception). OSTEOPOROSIS AND MENOPAUSE   Osteoporosis is a disease in which the bones lose minerals and strength with aging. This can result in serious bone fractures. Your risk for osteoporosis can be identified using a bone density scan.  If you are 105 years of age or older, or if you are at risk for osteoporosis and fractures, ask your health care provider if you should be screened.  Ask your health care provider whether you should take a calcium or vitamin D supplement to lower your risk for osteoporosis.  Menopause may have certain physical symptoms and risks.  Hormone replacement therapy may reduce some of these symptoms and risks. Talk to your health care provider about whether hormone replacement therapy is right for you.  HOME CARE INSTRUCTIONS   Schedule regular health, dental, and eye exams.  Stay current with your immunizations.   Do not use any tobacco products including cigarettes, chewing tobacco, or electronic cigarettes.  If you are pregnant, do not drink alcohol.  If you are breastfeeding, limit how much and how often you drink alcohol.  Limit alcohol intake to no more than 1 drink per day for nonpregnant women. One drink equals 12 ounces of beer, 5 ounces of wine, or 1 ounces of hard liquor.  Do not use street drugs.  Do not share needles.  Ask your health care provider for help if  you need support or information about quitting drugs.  Tell your health care provider if you often feel depressed.  Tell your health care provider if you have ever been abused or do not feel safe at home.   This information is not intended to replace advice given to you by your health care provider. Make sure you discuss any questions you have with your health care provider.   Document Released: 06/19/2011 Document Revised: 12/25/2014 Document Reviewed: 11/05/2013 Elsevier Interactive Patient Education 2016 [facility] is the group you likely saw in the past.

## 2016-08-02 NOTE — Progress Notes (Signed)
Left breast ultrasound scheduled for 08-08-16 at 1100 at [facility]. Map and directions given. Patient agreeable to date and time.

## 2016-08-02 NOTE — Progress Notes (Signed)
25 y.o. G47P0020 Single Caucasian female here for annual exam.    Needs birth control.  Does not like the NuvaRing.  Does not want anything implantable.  Wants Continuous contraception.  Used Seasonale and Seasonique in the past.   States history of anemia.  Does not do well with blood work.  Did have blood work in [place] this last winter.   Lived in [place] for about a year. Graduated from college.  Lives with her boyfriend.  PCP:   Dr. Ronnald Ramp - [facility]  Patient's last menstrual period was 07/15/2016.  LMP: 07/15/16         Sexually active: Yes.    The current method of family planning is condoms most of the time.    Exercising: Yes.    cardio Smoker:  no  Health Maintenance: Pap:  11/24/13 negative History of abnormal Pap:  no MMG:  n/a Colonoscopy:  n/a BMD:   N/a  Result  n/a TDaP:  2011?  Gardasil:   yes Screening Labs:  Hb today: per patient had labs done in January with doctor in [place], Urine today: --- NA.   reports that she has never smoked. She has never used smokeless tobacco. She reports that she drinks alcohol. She reports that she uses drugs, including Marijuana.  Past Medical History:  Diagnosis Date  . Allergic rhinitis    followed by Dr Fredderick Phenix  . Anemia   . Anxiety   . Depression   . Fibroadenoma of left breast 2012   Dx by ultrasound.   . STD (sexually transmitted disease) 08/2011   Tx'd for Chlamydia    Past Surgical History:  Procedure Laterality Date  . DILATION AND CURETTAGE OF UTERUS  09-04-13   TAB    Current Outpatient Prescriptions  Medication Sig Dispense Refill  . Levonorgestrel-Ethinyl Estradiol (SEASONIQUE) 0.15-0.03 &0.01 MG tablet Take 1 tablet by mouth daily. 1 Package 3   No current facility-administered medications for this visit.     Family History  Problem Relation Age of Onset  . Mental illness Mother   . Mental illness Maternal Grandmother   . Cancer Paternal Grandmother     skin cancer   . Thyroid disease Father   . Seizures Maternal Aunt     ROS:  Pertinent items are noted in HPI.  Otherwise, a comprehensive ROS was negative.  Exam:   BP 116/68 (BP Location: Right Arm, Patient Position: Sitting, Cuff Size: Normal)   Pulse 88   Resp 16   Ht 5' 7.5" (1.715 m)   Wt 136 lb (61.7 kg)   LMP 07/15/2016   BMI 20.99 kg/m     General appearance: alert, cooperative and appears stated age Head: Normocephalic, without obvious abnormality, atraumatic Neck: no adenopathy, supple, symmetrical, trachea midline and thyroid normal to inspection and palpation Lungs: clear to auscultation bilaterally Breasts: normal appearance, no masses or tenderness, No nipple retraction or dimpling, No nipple discharge or bleeding, No axillary or supraclavicular adenopathy.  Left breast with 7 mm cyst at 8:00 position.  Old change.  Heart: regular rate and rhythm Abdomen: soft, non-tender; no masses, no organomegaly Extremities: extremities normal, atraumatic, no cyanosis or edema Skin: Skin color, texture, turgor normal. No rashes or lesions.  3 mm dark nevus of the abdominal wall.  Lymph nodes: Cervical, supraclavicular, and axillary nodes normal. No abnormal inguinal nodes palpated Neurologic: Grossly normal  Pelvic: External genitalia:  no lesions  Urethra:  normal appearing urethra with no masses, tenderness or lesions              Bartholins and Skenes: normal                 Vagina: normal appearing vagina with normal color and discharge, no lesions              Cervix: no lesions              Pap taken: Yes.   Bimanual Exam:  Uterus:  normal size, contour, position, consistency, mobility, non-tender              Adnexa: no mass, fullness, tenderness             Chaperone was present for exam.  Assessment:   Well woman visit with normal exam. Hx anemia.  Left breast mass.  Prior dx of left breat fibroadenoma.  No follow up since ultrasound in 2012. Desire for STD  testing.  Pigmented nevus of the abdomen.   Plan: Yearly mammogram recommended after age 20.  Recommended self breast exam.  Pap and HR HPV as above. Discussed Calcium, Vitamin D, regular exercise program including cardiovascular and weight bearing exercise. She will call her dermatologist.  Left breast ultrasound at [facility].  Restart Seasonique.  Rx for one year.  Labs tomorrow - STD testing and routine labs. Follow up annually and prn.       After visit summary provided.

## 2016-08-03 ENCOUNTER — Other Ambulatory Visit: Payer: BLUE CROSS/BLUE SHIELD

## 2016-08-03 LAB — IPS N GONORRHOEA AND CHLAMYDIA BY PCR

## 2016-08-04 ENCOUNTER — Telehealth: Payer: Self-pay

## 2016-08-04 LAB — IPS PAP TEST WITH REFLEX TO HPV

## 2016-08-04 NOTE — Telephone Encounter (Signed)
Patient notified of results: see result note

## 2016-08-04 NOTE — Telephone Encounter (Signed)
Called patient at [phone] to discuss labs/pap results. Left message to call me back.

## 2016-08-04 NOTE — Telephone Encounter (Signed)
-----   Message from Nunzio Cobbs, MD sent at 08/04/2016 12:57 PM EDT ----- Please inform patient of her normal pap and negative GC/CT results.

## 2016-08-04 NOTE — Telephone Encounter (Signed)
Patient is returning a call to Amanda.

## 2016-08-07 ENCOUNTER — Other Ambulatory Visit: Payer: BLUE CROSS/BLUE SHIELD

## 2016-08-07 ENCOUNTER — Telehealth: Payer: Self-pay | Admitting: Obstetrics and Gynecology

## 2016-08-07 NOTE — Telephone Encounter (Signed)
Left message for patient to reschedule missed lab appointment.

## 2016-08-08 ENCOUNTER — Other Ambulatory Visit: Payer: BLUE CROSS/BLUE SHIELD

## 2016-08-09 NOTE — Telephone Encounter (Signed)
Thank you for the update.  Patient choice to return for labs.  I have closed the encounter.

## 2016-08-14 ENCOUNTER — Ambulatory Visit
Admission: RE | Admit: 2016-08-14 | Discharge: 2016-08-14 | Disposition: A | Discharge status: Home / Self Care | Payer: BLUE CROSS/BLUE SHIELD | Source: Ambulatory Visit | Attending: Obstetrics and Gynecology | Admitting: Obstetrics and Gynecology

## 2016-08-14 DIAGNOSIS — N63 Unspecified lump in unspecified breast: Secondary | ICD-10-CM

## 2017-08-06 ENCOUNTER — Ambulatory Visit (INDEPENDENT_AMBULATORY_CARE_PROVIDER_SITE_OTHER): Payer: BLUE CROSS/BLUE SHIELD | Admitting: Obstetrics and Gynecology

## 2017-08-06 ENCOUNTER — Encounter: Payer: Self-pay | Admitting: Obstetrics and Gynecology

## 2017-08-06 VITALS — BP 100/60 | HR 64 | Resp 14 | Ht 68.0 in | Wt 127.6 lb

## 2017-08-06 DIAGNOSIS — Z01419 Encounter for gynecological examination (general) (routine) without abnormal findings: Secondary | ICD-10-CM | POA: Diagnosis not present

## 2017-08-06 MED ORDER — NORETHIN-ETH ESTRAD-FE BIPHAS 1 MG-10 MCG / 10 MCG PO TABS: 1.0000 | ORAL_TABLET | Freq: Every day | ORAL | 3 refills | Status: DC

## 2017-08-06 NOTE — Progress Notes (Signed)
26 y.o. G40P0020 Single Caucasian female here for annual exam.    Increased stress with work.  Dysmenorrhea increasing.  Increased PMS and mood swings.   Stable relationship for 3 months.  Declines STD testing.   Lost 30 pounds intentionally.   Desires general blood work.  Has a counselor she sees every couple of weeks, Betsey Holiday.   PCP:   None  Patient's last menstrual period was 07/27/2017 (exact date).     Period Cycle (Days): 30 Period Duration (Days): 4-5 days Period Pattern: Regular Menstrual Flow:  (first 2 days heavy then light) Menstrual Control: Tampon, Maxi pad Menstrual Control Change Freq (Hours): every 2 hours Dysmenorrhea: (!) Moderate Dysmenorrhea Symptoms: Cramping, Headache     Sexually active: Yes.   female  The current method of family planning is none.    Exercising: Yes.    Weights and cardio Smoker:  no  Health Maintenance: Pap: 08-02-16 Neg; History of abnormal Pap:  no MMG:  08-14-16 Lt.Br.U/S--stable probable fibroadenoma of Lt.Br.Birads2/Benign:TBC Colonoscopy:  n/a BMD:   n/a  Result  n/a TDaP:  2011 Gardasil:   yes HIV: 03-18-14 Neg Hep C: 03-18-14 Neg Screening Labs:   ---   reports that she has never smoked. She has never used smokeless tobacco. She reports that she drinks about 0.6 oz of alcohol per week . She reports that she uses drugs, including Marijuana.  Past Medical History:  Diagnosis Date  . Allergic rhinitis    followed by Dr Fredderick Phenix  . Anemia   . Anxiety   . Depression   . Fibroadenoma of left breast 2012   Dx by ultrasound.   . STD (sexually transmitted disease) 08/2011   Tx'd for Chlamydia    Past Surgical History:  Procedure Laterality Date  . DILATION AND CURETTAGE OF UTERUS  09-04-13   TAB    No current outpatient prescriptions on file.   No current facility-administered medications for this visit.     Family History  Problem Relation Age of Onset  . Mental illness Mother   . Mental illness Maternal  Grandmother   . Cancer Paternal Grandmother        skin cancer  . Thyroid disease Father   . Seizures Maternal Aunt     ROS:  Pertinent items are noted in HPI.  Otherwise, a comprehensive ROS was negative.  Exam:   BP 100/60 (BP Location: Right Arm, Patient Position: Sitting, Cuff Size: Small)   Pulse 64   Resp 14   Ht 5\' 8"  (1.727 m)   Wt 127 lb 9.6 oz (57.9 kg)   LMP 07/27/2017 (Exact Date)   BMI 19.40 kg/m     General appearance: alert, cooperative and appears stated age Head: Normocephalic, without obvious abnormality, atraumatic Neck: no adenopathy, supple, symmetrical, trachea midline and thyroid normal to inspection and palpation Lungs: clear to auscultation bilaterally Breasts: normal appearance, no masses or tenderness, No nipple retraction or dimpling, No nipple discharge or bleeding, No axillary or supraclavicular adenopathy Heart: regular rate and rhythm Abdomen: soft, non-tender; no masses, no organomegaly Extremities: extremities normal, atraumatic, no cyanosis or edema Skin: Skin color, texture, turgor normal. No rashes or lesions Lymph nodes: Cervical, supraclavicular, and axillary nodes normal. No abnormal inguinal nodes palpated Neurologic: Grossly normal  Pelvic: External genitalia:  no lesions              Urethra:  normal appearing urethra with no masses, tenderness or lesions  Bartholins and Skenes: normal                 Vagina: normal appearing vagina with normal color and discharge, no lesions              Cervix: no lesions              Pap taken: No. Bimanual Exam:  Uterus:  normal size, contour, position, consistency, mobility, non-tender              Adnexa: no mass, fullness, tenderness             Chaperone was present for exam.  Assessment:   Well woman visit with normal exam. Dysmenorrhea.  Heat and cold intolerance. PMS and mood swings.  Stress at work.   Plan: Mammogram screening discussed. Recommended self breast  awareness. Pap and HR HPV as above. Guidelines for Calcium, Vitamin D, regular exercise program including cardiovascular and weight bearing exercise. LoLoestrin x 12 months.  Increase exercise.  Routine labs.  Follow up annually and prn.   After visit summary provided.

## 2017-08-06 NOTE — Patient Instructions (Signed)

## 2017-08-07 LAB — COMPREHENSIVE METABOLIC PANEL
ALT: 14 IU/L (ref 0–32)
AST: 21 IU/L (ref 0–40)
Albumin/Globulin Ratio: 1.8 (ref 1.2–2.2)
Albumin: 4.6 g/dL (ref 3.5–5.5)
Alkaline Phosphatase: 74 IU/L (ref 39–117)
BUN/Creatinine Ratio: 19 (ref 9–23)
BUN: 13 mg/dL (ref 6–20)
Bilirubin Total: 0.2 mg/dL (ref 0.0–1.2)
CALCIUM: 9.4 mg/dL (ref 8.7–10.2)
CO2: 25 mmol/L (ref 20–29)
Chloride: 101 mmol/L (ref 96–106)
Creatinine, Ser: 0.7 mg/dL (ref 0.57–1.00)
GFR, EST AFRICAN AMERICAN: 139 mL/min/{1.73_m2} (ref >59)
GFR, EST NON AFRICAN AMERICAN: 121 mL/min/{1.73_m2} (ref >59)
GLUCOSE: 74 mg/dL (ref 65–99)
Globulin, Total: 2.6 g/dL (ref 1.5–4.5)
Potassium: 4.5 mmol/L (ref 3.5–5.2)
Sodium: 137 mmol/L (ref 134–144)
TOTAL PROTEIN: 7.2 g/dL (ref 6.0–8.5)

## 2017-08-07 LAB — CBC
Hematocrit: 35.5 % (ref 34.0–46.6)
Hemoglobin: 11.5 g/dL (ref 11.1–15.9)
MCH: 27 pg (ref 26.6–33.0)
MCHC: 32.4 g/dL (ref 31.5–35.7)
MCV: 83 fL (ref 79–97)
PLATELETS: 379 10*3/uL (ref 150–379)
RBC: 4.26 x10E6/uL (ref 3.77–5.28)
RDW: 13.7 % (ref 12.3–15.4)
WBC: 5.6 10*3/uL (ref 3.4–10.8)

## 2017-08-07 LAB — LIPID PANEL
Chol/HDL Ratio: 2.1 ratio (ref 0.0–4.4)
Cholesterol, Total: 162 mg/dL (ref 100–199)
HDL: 78 mg/dL (ref >39)
LDL Calculated: 71 mg/dL (ref 0–99)
TRIGLYCERIDES: 66 mg/dL (ref 0–149)
VLDL Cholesterol Cal: 13 mg/dL (ref 5–40)

## 2017-08-07 LAB — TSH: TSH: 1.14 u[IU]/mL (ref 0.450–4.500)

## 2017-08-17 ENCOUNTER — Telehealth: Payer: Self-pay | Admitting: Obstetrics and Gynecology

## 2017-08-17 NOTE — Telephone Encounter (Signed)
Spoke with patient. Patient request alternative to Lo Loestrin Fe d/t cost. RN advised of savings card and provided instructions on how to use. Advised patient if alternative is still desired, return call to office. Patient thankful for assistance and verbalizes understanding.   Routing to provider for final review. Patient is agreeable to disposition. Will close encounter.

## 2017-08-17 NOTE — Telephone Encounter (Signed)
Patient requesting a different birthcontrol pill.  States the out of pocket cost for the Lo Loestrin is over $300.

## 2017-08-27 ENCOUNTER — Telehealth: Payer: Self-pay | Admitting: Obstetrics and Gynecology

## 2017-08-27 NOTE — Telephone Encounter (Signed)
Spoke with patient. Patient states that she was in to see Dr.Silva on 08/06/2017. Was given rx for Lo Loestrin Fe. States this medication is too costly even with savings card. Patient was previously on Seasonale but was having trouble with weight gain. Asking if she could try generic for Lutera 0.1-20 mg-mcg. Has many friends on this who have done well. Advised will review with covering MD and return call.

## 2017-08-27 NOTE — Telephone Encounter (Signed)
Spoke with patient, advised as seen below per Dr. Sabra Heck. Patient states what she wanted to know was if she could go back to the Seasonale since she already has this prescription filled, then switch to Lutera at later date if she decides? Advised will review with Dr. Sabra Heck and return call.   Dr. Sabra Heck- ok to go back to Seasonale?

## 2017-08-27 NOTE — Telephone Encounter (Signed)
Patient would like to speak with nurse about her birth control prescription. °

## 2017-08-27 NOTE — Telephone Encounter (Signed)
OK to switch to [place] and send to pharmacy of her request.

## 2017-08-27 NOTE — Telephone Encounter (Signed)
Left message to call Genia Perin at [phone]. 

## 2017-08-27 NOTE — Telephone Encounter (Signed)
Left message to call Kaitlyn at [phone]. 

## 2017-08-27 NOTE — Telephone Encounter (Signed)
Left message to call [facility] at [phone].  Patient is taking Lo Loestrin Fe.

## 2017-08-27 NOTE — Telephone Encounter (Signed)
Spoke with patient, advised as seen below per Dr. Sabra Heck. Patient verbalizes understanding and is agreeable.  Will close encounter.

## 2017-08-27 NOTE — Telephone Encounter (Signed)
Return call to Kaitlyn. °

## 2017-08-27 NOTE — Telephone Encounter (Signed)
Patient returning your call.

## 2017-08-27 NOTE — Telephone Encounter (Signed)
Yes it is fine to just restart the [place] but her side effects will likely be the same.  Once notified, ok to close encounter.

## 2018-06-11 ENCOUNTER — Telehealth: Payer: Self-pay | Admitting: Obstetrics and Gynecology

## 2018-06-11 NOTE — Telephone Encounter (Signed)
Spoke with patient. Patient reports intermittent dull, sharp LLQ pain 5/10 and "possibly" urinary frequency. Symptoms started today. Last BM today, reports as loose, can have loose or formed stool in same day, hx IBS, "can be my normal".  Denies fever/chills, N/V, bleeding, lower back pain.   LMP -unsure of exact date, 2-3 wks ago. Menses has been regular with normal flow. SA, no contraceptive.   Has not taken anything for pain. Recommended OV for further evaluation, OV scheduled for 06/13/18 at 1pm with Dr. Quincy Simmonds. ER precautions provided for severe pain or if new symptoms develop.   Advised will review with covering provider and return call with any additional recommendations. Patient agreeable.  Routing to covering provider for final review. Patient is agreeable to disposition. Will close encounter. Marland Kitchen   Cc: Dr. Quincy Simmonds

## 2018-06-11 NOTE — Telephone Encounter (Signed)
Patient is having pain on her right side and would like to talk with a nurse.

## 2018-06-13 ENCOUNTER — Ambulatory Visit: Payer: Self-pay | Admitting: Obstetrics and Gynecology

## 2018-06-14 ENCOUNTER — Other Ambulatory Visit: Payer: Self-pay

## 2018-06-14 ENCOUNTER — Other Ambulatory Visit (HOSPITAL_COMMUNITY)
Admission: RE | Admit: 2018-06-14 | Discharge: 2018-06-14 | Disposition: A | Discharge status: Home / Self Care | Payer: 59 | Source: Ambulatory Visit | Attending: Obstetrics and Gynecology | Admitting: Obstetrics and Gynecology

## 2018-06-14 ENCOUNTER — Encounter: Payer: Self-pay | Admitting: Obstetrics and Gynecology

## 2018-06-14 ENCOUNTER — Ambulatory Visit (INDEPENDENT_AMBULATORY_CARE_PROVIDER_SITE_OTHER): Payer: 59 | Admitting: Obstetrics and Gynecology

## 2018-06-14 VITALS — BP 121/77 | HR 76

## 2018-06-14 DIAGNOSIS — R1032 Left lower quadrant pain: Secondary | ICD-10-CM | POA: Diagnosis not present

## 2018-06-14 LAB — POCT URINE PREGNANCY: PREG TEST UR: NEGATIVE

## 2018-06-14 LAB — POCT URINALYSIS DIPSTICK
Glucose, UA: NEGATIVE
Leukocytes, UA: NEGATIVE
PROTEIN UA: NEGATIVE
RBC UA: NEGATIVE
SPEC GRAV UA: 1.01 (ref 1.010–1.025)
UROBILINOGEN UA: NEGATIVE U/dL — AB
pH, UA: 5 (ref 5.0–8.0)

## 2018-06-14 MED ORDER — NORETHINDRONE ACET-ETHINYL EST 1.5-30 MG-MCG PO TABS: 1.0000 | ORAL_TABLET | Freq: Every day | ORAL | 2 refills | Status: DC

## 2018-06-14 NOTE — Patient Instructions (Signed)
Ethinyl Estradiol; Norethindrone Acetate tablets (contraception) What is this medicine? ETHINYL ESTRADIOL; NORETHINDRONE ACETATE (ETH in il es tra DYE ole; nor eth IN drone AS e tate) is an oral contraceptive. The products combine two types of female hormones, an estrogen and a progestin. They are used to prevent ovulation and pregnancy. This medicine may be used for other purposes; ask your health care provider or pharmacist if you have questions. COMMON BRAND NAME(S): Laddie Aquas 1.5/30, Junel 1/20, LARIN, Loestrin 1.5/30, Loestrin 1/20, Microgestin 1.5/30, Microgestin 1/20 What should I tell my health care provider before I take this medicine? They need to know if you have or ever had any of these conditions: -abnormal vaginal bleeding -blood vessel disease or blood clots -breast, cervical, endometrial, ovarian, liver, or uterine cancer -diabetes -gallbladder disease -heart disease or recent heart attack -high blood pressure -high cholesterol -kidney disease -liver disease -migraine headaches -stroke -systemic lupus erythematosus (SLE) -tobacco smoker -an unusual or allergic reaction to estrogens, progestins, other medicines, foods, dyes, or preservatives -pregnant or trying to get pregnant -breast-feeding How should I use this medicine? Take this medicine by mouth. To reduce nausea, this medicine may be taken with food. Follow the directions on the prescription label. Take this medicine at the same time each day and in the order directed on the package. Do not take your medicine more often than directed. Contact your pediatrician regarding the use of this medicine in children. Special care may be needed. This medicine has been used in female children who have started having menstrual periods. A patient package insert for the product will be given with each prescription and refill. Read this sheet carefully each time. The sheet may change frequently. Overdosage: If you think you  have taken too much of this medicine contact a poison control center or emergency room at once. NOTE: This medicine is only for you. Do not share this medicine with others. What if I miss a dose? If you miss a dose, refer to the patient information sheet you received with your medicine for direction. If you miss more than one pill, this medicine may not be as effective and you may need to use another form of birth control. What may interact with this medicine? Do not take this medicine with the following medication: -dasabuvir; ombitasvir; paritaprevir; ritonavir -ombitasvir; paritaprevir; ritonavir This medicine may also interact with the following medications: -acetaminophen -antibiotics or medicines for infections, especially rifampin, rifabutin, rifapentine, and griseofulvin, and possibly penicillins or tetracyclines -aprepitant -ascorbic acid (vitamin C) -atorvastatin -barbiturate medicines, such as phenobarbital -bosentan -carbamazepine -caffeine -clofibrate -cyclosporine -dantrolene -doxercalciferol -felbamate -grapefruit juice -hydrocortisone -medicines for anxiety or sleeping problems, such as diazepam or temazepam -medicines for diabetes, including pioglitazone -mineral oil -modafinil -mycophenolate -nefazodone -oxcarbazepine -phenytoin -prednisolone -ritonavir or other medicines for HIV infection or AIDS -rosuvastatin -selegiline -soy isoflavones supplements -St. John's wort -tamoxifen or raloxifene -theophylline -thyroid hormones -topiramate -warfarin This list may not describe all possible interactions. Give your health care provider a list of all the medicines, herbs, non-prescription drugs, or dietary supplements you use. Also tell them if you smoke, drink alcohol, or use illegal drugs. Some items may interact with your medicine. What should I watch for while using this medicine? Visit your doctor or health care professional for regular checks on your  progress. You will need a regular breast and pelvic exam and Pap smear while on this medicine. Use an additional method of contraception during the first cycle that you take these tablets. If you have any  reason to think you are pregnant, stop taking this medicine right away and contact your doctor or health care professional. If you are taking this medicine for hormone related problems, it may take several cycles of use to see improvement in your condition. Smoking increases the risk of getting a blood clot or having a stroke while you are taking birth control pills, especially if you are more than 26 years old. You are strongly advised not to smoke. This medicine can make your body retain fluid, making your fingers, hands, or ankles swell. Your blood pressure can go up. Contact your doctor or health care professional if you feel you are retaining fluid. This medicine can make you more sensitive to the sun. Keep out of the sun. If you cannot avoid being in the sun, wear protective clothing and use sunscreen. Do not use sun lamps or tanning beds/booths. If you wear contact lenses and notice visual changes, or if the lenses begin to feel uncomfortable, consult your eye care specialist. In some women, tenderness, swelling, or minor bleeding of the gums may occur. Notify your dentist if this happens. Brushing and flossing your teeth regularly may help limit this. See your dentist regularly and inform your dentist of the medicines you are taking. If you are going to have elective surgery, you may need to stop taking this medicine before the surgery. Consult your health care professional for advice. This medicine does not protect you against HIV infection (AIDS) or any other sexually transmitted diseases. What side effects may I notice from receiving this medicine? Side effects that you should report to your doctor or health care professional as soon as possible: -breast tissue changes or discharge -changes  in vaginal bleeding during your period or between your periods -chest pain -coughing up blood -dizziness or fainting spells -headaches or migraines -leg, arm or groin pain -severe or sudden headaches -stomach pain (severe) -sudden shortness of breath -sudden loss of coordination, especially on one side of the body -speech problems -symptoms of vaginal infection like itching, irritation or unusual discharge -tenderness in the upper abdomen -vomiting -weakness or numbness in the arms or legs, especially on one side of the body -yellowing of the eyes or skin Side effects that usually do not require medical attention (report to your doctor or health care professional if they continue or are bothersome): -breakthrough bleeding and spotting that continues beyond the 3 initial cycles of pills -breast tenderness -mood changes, anxiety, depression, frustration, anger, or emotional outbursts -increased sensitivity to sun or ultraviolet light -nausea -skin rash, acne, or brown spots on the skin -weight gain (slight) This list may not describe all possible side effects. Call your doctor for medical advice about side effects. You may report side effects to FDA at 1-800-FDA-1088. Where should I keep my medicine? Keep out of the reach of children. Store at room temperature between 15 and 30 degrees C (59 and 86 degrees F). Throw away any unused medicine after the expiration date. NOTE: This sheet is a summary. It may not cover all possible information. If you have questions about this medicine, talk to your doctor, pharmacist, or health care provider.  2018 Elsevier/Gold Standard (2016-08-14 08:02:50)  

## 2018-06-14 NOTE — Progress Notes (Signed)
GYNECOLOGY  VISIT   HPI: 27 y.o.   Single  Caucasian  female   G2P0020 with LMP: patient is unsure of date, 2-3 weeks ago here for   Patient reports intermittent dull, sharp LLQ pain 5/10 and "possibly" urinary frequency. Symptoms started today. Last BM today, reports as loose, can have loose or formed stool in same day, hx IBS, "can be my normal".  Pain goes away with heat.  Feeling much better now.   Has had episodes of contraction intense type pain over the last year.   Denies fever/chills, N/V, bleeding, lower back pain.  No blood in her urine .  No dysuria.   LMP -unsure of exact date, 2-3 wks ago. Menses has been regular with normal flow. Not SA for 2 months, no contraceptive.   Sometimes has pain with intercourse.   Some anxiety prior to her menses.   Did receive a low dose antianxiety medication from her GI MD.   [place]   UPT negative.  Urine dip negative   GYNECOLOGIC HISTORY: LMP: patient is unsure of date Contraception: None Last pap smear:   08/02/2016 negative,  History of abnormal Pap:  no MMG:  08-14-16 Lt.Br.U/S--stable probable fibroadenoma of Lt.Br.Birads2/Benign:TBC Colonoscopy:  n/a BMD:   n/a  Result  n/a TDaP:  2011 Gardasil:   yes        OB History    Gravida  2   Para      Term      Preterm      AB  2   Living        SAB  1   TAB  1   Ectopic      Multiple      Live Births                 There are no active problems to display for this patient.   Past Medical History:  Diagnosis Date  . Allergic rhinitis    followed by Dr Fredderick Phenix  . Anemia   . Anxiety   . Depression   . Fibroadenoma of left breast 2012   Dx by ultrasound.   . STD (sexually transmitted disease) 08/2011   Tx'd for Chlamydia    Past Surgical History:  Procedure Laterality Date  . DILATION AND CURETTAGE OF UTERUS  09-04-13   TAB    Current Outpatient Medications  Medication Sig Dispense Refill  .  Norethindrone Acetate-Ethinyl Estradiol (LOESTRIN 1.5/30, 21,) 1.5-30 MG-MCG tablet Take 1 tablet by mouth daily. 1 Package 2   No current facility-administered medications for this visit.      ALLERGIES: Doxycycline and Hydrocodone-acetaminophen  Family History  Problem Relation Age of Onset  . Mental illness Mother   . Mental illness Maternal Grandmother   . Cancer Paternal Grandmother        skin cancer  . Thyroid disease Father   . Seizures Maternal Aunt     Social History   Socioeconomic History  . Marital status: Single    Spouse name: Not on file  . Number of children: Not on file  . Years of education: Not on file  . Highest education level: Not on file  Occupational History  . Not on file  Social Needs  . Financial resource strain: Not on file  . Food insecurity:    Worry: Not on file    Inability: Not on file  . Transportation needs:    Medical: Not on  file    Non-medical: Not on file  Tobacco Use  . Smoking status: Never Smoker  . Smokeless tobacco: Never Used  Substance and Sexual Activity  . Alcohol use: Yes    Alcohol/week: 0.6 oz    Types: 1 Glasses of wine per week    Comment: social  . Drug use: Yes    Types: Marijuana    Comment: occ  . Sexual activity: Not Currently    Partners: Male    Birth control/protection: None  Lifestyle  . Physical activity:    Days per week: Not on file    Minutes per session: Not on file  . Stress: Not on file  Relationships  . Social connections:    Talks on phone: Not on file    Gets together: Not on file    Attends religious service: Not on file    Active member of club or organization: Not on file    Attends meetings of clubs or organizations: Not on file    Relationship status: Not on file  . Intimate partner violence:    Fear of current or ex partner: Not on file    Emotionally abused: Not on file    Physically abused: Not on file    Forced sexual activity: Not on file  Other Topics Concern  . Not  on file  Social History Narrative  . Not on file    Review of Systems  Constitutional: Negative.   HENT: Negative.   Gastrointestinal: Positive for abdominal pain.       Bloating  Endocrine: Positive for cold intolerance.  Genitourinary: Positive for pelvic pain.       Loss of sexual interest    PHYSICAL EXAMINATION:    BP 121/77 (BP Location: Right Arm, Patient Position: Sitting)   Pulse 76     General appearance: alert, cooperative and appears stated age Head: Normocephalic, without obvious abnormality, atraumatic Neck: no adenopathy, supple, symmetrical, trachea midline and thyroid normal to inspection and palpation Lungs: clear to auscultation bilaterally Heart: regular rate and rhythm Abdomen: soft, non-tender, no masses,  no organomegaly    Pelvic: External genitalia:  no lesions              Urethra:  normal appearing urethra with no masses, tenderness or lesions              Bartholins and Skenes: normal                 Vagina: normal appearing vagina with normal color and discharge, no lesions              Cervix: no lesions                Bimanual Exam:  Uterus:  normal size, contour, position, consistency, mobility, non-tender       Chaperone was present for exam.  ASSESSMENT  LLQ pain.   I suspect ovarian cyst rupture.  Pelvic pain.  IBS. Anxiety.    PLAN  We talked about causes of pain being potentially ovarian cysts or IBS.  GC/CT/trich testing.  Will start LoEstrin 1.5/30.  Start with next menses. FU in 2 months for annual exam.  If pain persists, will do pelvic US. If anxiety persists, may try an SSRI for PMDD.    An After Visit Summary was printed and given to the patient.  ___25___ minutes face to face time of which over 50% was spent in counseling.

## 2018-06-17 LAB — CERVICOVAGINAL ANCILLARY ONLY
CHLAMYDIA, DNA PROBE: NEGATIVE
NEISSERIA GONORRHEA: NEGATIVE
Trichomonas: NEGATIVE

## 2018-06-18 ENCOUNTER — Telehealth: Payer: Self-pay | Admitting: Obstetrics and Gynecology

## 2018-06-18 NOTE — Telephone Encounter (Signed)
Call to patient. Advised of instructions from Dr Quincy Simmonds.  Patient states she would prefer alternative to amitriptyline. Has read about other side effects.  Appointment scheduled for 06-21-18 to discuss options.   Encounter closed.

## 2018-06-18 NOTE — Telephone Encounter (Signed)
Possible interaction to increase blood pressure.  I recommend having a recheck appointment in 6 weeks to check her BP and see how she is feeling on the new OCP.

## 2018-06-18 NOTE — Telephone Encounter (Signed)
Return call to patient.  Was prescribed Amitriptyline by GI physician for anxiety. Also just given RX to start OCP.  Has not started either medication yet. Was to notify Dr Quincy Simmonds the name of medication to see if these could work together. Advised Dr Quincy Simmonds will review call and we will call her back.

## 2018-06-18 NOTE — Telephone Encounter (Signed)
Patient says the name of the medication she needed to let Dr Quincy Simmonds know about is Amitriptyline 10mg .

## 2018-06-19 NOTE — Progress Notes (Deleted)
GYNECOLOGY  VISIT   HPI: 27 y.o.   Single  Caucasian  female   G2P0020 with No LMP recorded.   here for discuss med options     GYNECOLOGIC HISTORY: No LMP recorded. Contraception:  *** Menopausal hormone therapy:  none Last mammogram:  08-14-16 Lt.Br.U/S--stable probable fibroadenoma of Lt.Br.Birads2/Benign:TBC Last pap smear:   08/02/16 Negative        OB History    Gravida  2   Para      Term      Preterm      AB  2   Living        SAB  1   TAB  1   Ectopic      Multiple      Live Births                 There are no active problems to display for this patient.   Past Medical History:  Diagnosis Date  . Allergic rhinitis    followed by Dr Fredderick Phenix  . Anemia   . Anxiety   . Depression   . Fibroadenoma of left breast 2012   Dx by ultrasound.   . STD (sexually transmitted disease) 08/2011   Tx'd for Chlamydia    Past Surgical History:  Procedure Laterality Date  . DILATION AND CURETTAGE OF UTERUS  09-04-13   TAB    Current Outpatient Medications  Medication Sig Dispense Refill  . Norethindrone Acetate-Ethinyl Estradiol (LOESTRIN 1.5/30, 21,) 1.5-30 MG-MCG tablet Take 1 tablet by mouth daily. 1 Package 2   No current facility-administered medications for this visit.      ALLERGIES: Doxycycline and Hydrocodone-acetaminophen  Family History  Problem Relation Age of Onset  . Mental illness Mother   . Mental illness Maternal Grandmother   . Cancer Paternal Grandmother        skin cancer  . Thyroid disease Father   . Seizures Maternal Aunt     Social History   Socioeconomic History  . Marital status: Single    Spouse name: Not on file  . Number of children: Not on file  . Years of education: Not on file  . Highest education level: Not on file  Occupational History  . Not on file  Social Needs  . Financial resource strain: Not on file  . Food insecurity:    Worry: Not on file    Inability: Not on file  . Transportation needs:   Medical: Not on file    Non-medical: Not on file  Tobacco Use  . Smoking status: Never Smoker  . Smokeless tobacco: Never Used  Substance and Sexual Activity  . Alcohol use: Yes    Alcohol/week: 0.6 oz    Types: 1 Glasses of wine per week    Comment: social  . Drug use: Yes    Types: Marijuana    Comment: occ  . Sexual activity: Not Currently    Partners: Male    Birth control/protection: None  Lifestyle  . Physical activity:    Days per week: Not on file    Minutes per session: Not on file  . Stress: Not on file  Relationships  . Social connections:    Talks on phone: Not on file    Gets together: Not on file    Attends religious service: Not on file    Active member of club or organization: Not on file    Attends meetings of clubs or organizations: Not on file  Relationship status: Not on file  . Intimate partner violence:    Fear of current or ex partner: Not on file    Emotionally abused: Not on file    Physically abused: Not on file    Forced sexual activity: Not on file  Other Topics Concern  . Not on file  Social History Narrative  . Not on file    Review of Systems  PHYSICAL EXAMINATION:    There were no vitals taken for this visit.    General appearance: alert, cooperative and appears stated age Head: Normocephalic, without obvious abnormality, atraumatic Neck: no adenopathy, supple, symmetrical, trachea midline and thyroid normal to inspection and palpation Lungs: clear to auscultation bilaterally Breasts: normal appearance, no masses or tenderness, No nipple retraction or dimpling, No nipple discharge or bleeding, No axillary or supraclavicular adenopathy Heart: regular rate and rhythm Abdomen: soft, non-tender, no masses,  no organomegaly Extremities: extremities normal, atraumatic, no cyanosis or edema Skin: Skin color, texture, turgor normal. No rashes or lesions Lymph nodes: Cervical, supraclavicular, and axillary nodes normal. No abnormal  inguinal nodes palpated Neurologic: Grossly normal  Pelvic: External genitalia:  no lesions              Urethra:  normal appearing urethra with no masses, tenderness or lesions              Bartholins and Skenes: normal                 Vagina: normal appearing vagina with normal color and discharge, no lesions              Cervix: no lesions                Bimanual Exam:  Uterus:  normal size, contour, position, consistency, mobility, non-tender              Adnexa: no mass, fullness, tenderness              Rectal exam: {yes no:[id number]}.  Confirms.              Anus:  normal sphincter tone, no lesions  Chaperone was present for exam.  ASSESSMENT     PLAN     An After Visit Summary was printed and given to the patient.  ______ minutes face to face time of which over 50% was spent in counseling.

## 2018-06-21 ENCOUNTER — Ambulatory Visit: Payer: 59 | Admitting: Obstetrics and Gynecology

## 2018-06-24 ENCOUNTER — Ambulatory Visit: Payer: 59 | Admitting: Obstetrics and Gynecology

## 2018-06-24 NOTE — Progress Notes (Deleted)
GYNECOLOGY  VISIT   HPI: 27 y.o.   Single  Caucasian  female   G2P0020 with No LMP recorded.   here for     GYNECOLOGIC HISTORY: No LMP recorded. Contraception:  *** Menopausal hormone therapy:  n/a Last mammogram:  n/a Last pap smear:    08/02/2016 negative        OB History    Gravida  2   Para      Term      Preterm      AB  2   Living        SAB  1   TAB  1   Ectopic      Multiple      Live Births                 There are no active problems to display for this patient.   Past Medical History:  Diagnosis Date  . Allergic rhinitis    followed by Dr Fredderick Phenix  . Anemia   . Anxiety   . Depression   . Fibroadenoma of left breast 2012   Dx by ultrasound.   . STD (sexually transmitted disease) 08/2011   Tx'd for Chlamydia    Past Surgical History:  Procedure Laterality Date  . DILATION AND CURETTAGE OF UTERUS  09-04-13   TAB    Current Outpatient Medications  Medication Sig Dispense Refill  . Norethindrone Acetate-Ethinyl Estradiol (LOESTRIN 1.5/30, 21,) 1.5-30 MG-MCG tablet Take 1 tablet by mouth daily. 1 Package 2   No current facility-administered medications for this visit.      ALLERGIES: Doxycycline and Hydrocodone-acetaminophen  Family History  Problem Relation Age of Onset  . Mental illness Mother   . Mental illness Maternal Grandmother   . Cancer Paternal Grandmother        skin cancer  . Thyroid disease Father   . Seizures Maternal Aunt     Social History   Socioeconomic History  . Marital status: Single    Spouse name: Not on file  . Number of children: Not on file  . Years of education: Not on file  . Highest education level: Not on file  Occupational History  . Not on file  Social Needs  . Financial resource strain: Not on file  . Food insecurity:    Worry: Not on file    Inability: Not on file  . Transportation needs:    Medical: Not on file    Non-medical: Not on file  Tobacco Use  . Smoking status: Never  Smoker  . Smokeless tobacco: Never Used  Substance and Sexual Activity  . Alcohol use: Yes    Alcohol/week: 0.6 oz    Types: 1 Glasses of wine per week    Comment: social  . Drug use: Yes    Types: Marijuana    Comment: occ  . Sexual activity: Not Currently    Partners: Male    Birth control/protection: None  Lifestyle  . Physical activity:    Days per week: Not on file    Minutes per session: Not on file  . Stress: Not on file  Relationships  . Social connections:    Talks on phone: Not on file    Gets together: Not on file    Attends religious service: Not on file    Active member of club or organization: Not on file    Attends meetings of clubs or organizations: Not on file    Relationship status: Not  on file  . Intimate partner violence:    Fear of current or ex partner: Not on file    Emotionally abused: Not on file    Physically abused: Not on file    Forced sexual activity: Not on file  Other Topics Concern  . Not on file  Social History Narrative  . Not on file    Review of Systems  PHYSICAL EXAMINATION:    There were no vitals taken for this visit.    General appearance: alert, cooperative and appears stated age Head: Normocephalic, without obvious abnormality, atraumatic Neck: no adenopathy, supple, symmetrical, trachea midline and thyroid normal to inspection and palpation Lungs: clear to auscultation bilaterally Breasts: normal appearance, no masses or tenderness, No nipple retraction or dimpling, No nipple discharge or bleeding, No axillary or supraclavicular adenopathy Heart: regular rate and rhythm Abdomen: soft, non-tender, no masses,  no organomegaly Extremities: extremities normal, atraumatic, no cyanosis or edema Skin: Skin color, texture, turgor normal. No rashes or lesions Lymph nodes: Cervical, supraclavicular, and axillary nodes normal. No abnormal inguinal nodes palpated Neurologic: Grossly normal  Pelvic: External genitalia:  no  lesions              Urethra:  normal appearing urethra with no masses, tenderness or lesions              Bartholins and Skenes: normal                 Vagina: normal appearing vagina with normal color and discharge, no lesions              Cervix: no lesions                Bimanual Exam:  Uterus:  normal size, contour, position, consistency, mobility, non-tender              Adnexa: no mass, fullness, tenderness              Rectal exam: {yes no:[id number]}.  Confirms.              Anus:  normal sphincter tone, no lesions  Chaperone was present for exam.  ASSESSMENT     PLAN     An After Visit Summary was printed and given to the patient.  ______ minutes face to face time of which over 50% was spent in counseling.

## 2018-06-28 ENCOUNTER — Ambulatory Visit (INDEPENDENT_AMBULATORY_CARE_PROVIDER_SITE_OTHER): Payer: 59 | Admitting: Obstetrics and Gynecology

## 2018-06-28 ENCOUNTER — Encounter: Payer: Self-pay | Admitting: Obstetrics and Gynecology

## 2018-06-28 VITALS — BP 110/62 | HR 76 | Resp 16 | Ht 68.0 in | Wt 127.0 lb

## 2018-06-28 DIAGNOSIS — F419 Anxiety disorder, unspecified: Secondary | ICD-10-CM

## 2018-06-28 MED ORDER — PAROXETINE HCL 10 MG PO TABS: 10.0000 mg | ORAL_TABLET | ORAL | 1 refills | Status: DC

## 2018-06-28 NOTE — Progress Notes (Signed)
GYNECOLOGY  VISIT   HPI: 27 y.o.   Single  Caucasian  female   G2P0020 with Patient's last menstrual period was 06/21/2018 (approximate).   here for med check; patient states that she has not started the OCP prescribed. Patient would like to discus depression.   Noticing anxiety more than depression.  Feels like she has social anxiety.  Wants help for this.  Used Celexa and Zoloft in the past.  Does not remember the effect of Celexa.  She kept having to increase the Zoloft and did not like this.  Not in counseling.   Received Rx for amitryptaline from GI but has not taken this and does not want to.   LLQ pain is coming and going.  Thinks it is gas.   Having a sensation of pokes in her back. Can have discomfort like a catch when she takes a deep breath.   GYNECOLOGIC HISTORY: Patient's last menstrual period was 06/21/2018 (approximate). Contraception:  none Menopausal hormone therapy:  none Last mammogram:  n/a Last pap smear:   08/02/16 Pap smear Negative        OB History    Gravida  2   Para      Term      Preterm      AB  2   Living        SAB  1   TAB  1   Ectopic      Multiple      Live Births                 There are no active problems to display for this patient.   Past Medical History:  Diagnosis Date  . Allergic rhinitis    followed by Dr Fredderick Phenix  . Anemia   . Anxiety   . Depression   . Fibroadenoma of left breast 2012   Dx by ultrasound.   . IBS (irritable bowel syndrome)   . STD (sexually transmitted disease) 08/2011   Tx'd for Chlamydia    Past Surgical History:  Procedure Laterality Date  . DILATION AND CURETTAGE OF UTERUS  09-04-13   TAB    Current Outpatient Medications  Medication Sig Dispense Refill  . Norethindrone Acetate-Ethinyl Estradiol (LOESTRIN 1.5/30, 21,) 1.5-30 MG-MCG tablet Take 1 tablet by mouth daily. (Patient not taking: Reported on 06/28/2018) 1 Package 2  . PARoxetine (PAXIL) 10 MG tablet Take 1 tablet  (10 mg total) by mouth every morning. 30 tablet 1   No current facility-administered medications for this visit.      ALLERGIES: Doxycycline and Hydrocodone-acetaminophen  Family History  Problem Relation Age of Onset  . Mental illness Mother   . Mental illness Maternal Grandmother   . Cancer Paternal Grandmother        skin cancer  . Thyroid disease Father   . Seizures Maternal Aunt     Social History   Socioeconomic History  . Marital status: Single    Spouse name: Not on file  . Number of children: Not on file  . Years of education: Not on file  . Highest education level: Not on file  Occupational History  . Not on file  Social Needs  . Financial resource strain: Not on file  . Food insecurity:    Worry: Not on file    Inability: Not on file  . Transportation needs:    Medical: Not on file    Non-medical: Not on file  Tobacco Use  . Smoking status:  Never Smoker  . Smokeless tobacco: Never Used  Substance and Sexual Activity  . Alcohol use: Yes    Alcohol/week: 0.6 oz    Types: 1 Glasses of wine per week    Comment: social  . Drug use: Yes    Types: Marijuana    Comment: occ  . Sexual activity: Not Currently    Partners: Male    Birth control/protection: None  Lifestyle  . Physical activity:    Days per week: Not on file    Minutes per session: Not on file  . Stress: Not on file  Relationships  . Social connections:    Talks on phone: Not on file    Gets together: Not on file    Attends religious service: Not on file    Active member of club or organization: Not on file    Attends meetings of clubs or organizations: Not on file    Relationship status: Not on file  . Intimate partner violence:    Fear of current or ex partner: Not on file    Emotionally abused: Not on file    Physically abused: Not on file    Forced sexual activity: Not on file  Other Topics Concern  . Not on file  Social History Narrative  . Not on file    Review of Systems   Constitutional: Negative.   HENT: Negative.   Eyes: Negative.   Respiratory: Negative.   Cardiovascular: Negative.   Gastrointestinal: Negative.   Endocrine: Negative.   Genitourinary: Negative.   Musculoskeletal: Negative.   Skin: Negative.   Allergic/Immunologic: Negative.   Neurological: Negative.   Hematological: Negative.   Psychiatric/Behavioral: Negative.     PHYSICAL EXAMINATION:    BP 110/62   Pulse 76   Resp 16   Ht 5\' 8"  (1.727 m)   Wt 127 lb (57.6 kg)   LMP 06/21/2018 (Approximate)   BMI 19.31 kg/m     General appearance: alert, cooperative and appears stated age  ASSESSMENT  Anxiety.  Random pains.   PLAN  Paxil 10 mg daily.  She may start with 5 mg for one week and then work up to 10 mg daily.  We discussed side effects including serotonin syndrome.  I gave her a card for [facility] Amsler for counseling.  FU in 6 weeks.  She will see her PCP if her pains persist.  We talked about yoga, stretching, and using a stand up desk at work.    An After Visit Summary was printed and given to the patient.  __15____ minutes face to face time of which over 50% was spent in counseling.

## 2018-06-28 NOTE — Patient Instructions (Signed)
Paroxetine tablets What is this medicine? PAROXETINE (pa ROX e teen) is used to treat depression. It may also be used to treat anxiety disorders, obsessive compulsive disorder, panic attacks, post traumatic stress, and premenstrual dysphoric disorder (PMDD). This medicine may be used for other purposes; ask your health care provider or pharmacist if you have questions. COMMON BRAND NAME(S): Paxil, Pexeva What should I tell my health care provider before I take this medicine? They need to know if you have any of these conditions: -bipolar disorder or a family history of bipolar disorder -bleeding disorders -glaucoma -heart disease -kidney disease -liver disease -low levels of sodium in the blood -seizures -suicidal thoughts, plans, or attempt; a previous suicide attempt by you or a family member -take MAOIs like Carbex, Eldepryl, Marplan, Nardil, and Parnate -take medicines that treat or prevent blood clots -thyroid disease -an unusual or allergic reaction to paroxetine, other medicines, foods, dyes, or preservatives -pregnant or trying to get pregnant -breast-feeding How should I use this medicine? Take this medicine by mouth with a glass of water. Follow the directions on the prescription label. You can take it with or without food. Take your medicine at regular intervals. Do not take your medicine more often than directed. Do not stop taking this medicine suddenly except upon the advice of your doctor. Stopping this medicine too quickly may cause serious side effects or your condition may worsen. A special MedGuide will be given to you by the pharmacist with each prescription and refill. Be sure to read this information carefully each time. Talk to your pediatrician regarding the use of this medicine in children. Special care may be needed. Overdosage: If you think you have taken too much of this medicine contact a poison control center or emergency room at once. NOTE: This medicine is  only for you. Do not share this medicine with others. What if I miss a dose? If you miss a dose, take it as soon as you can. If it is almost time for your next dose, take only that dose. Do not take double or extra doses. What may interact with this medicine? Do not take this medicine with any of the following medications: -linezolid -MAOIs like Carbex, Eldepryl, Marplan, Nardil, and Parnate -methylene blue (injected into a vein) -pimozide -thioridazine This medicine may also interact with the following medications: -alcohol -amphetamines -aspirin and aspirin-like medicines -atomoxetine -certain medicines for depression, anxiety, or psychotic disturbances -certain medicines for irregular heart beat like propafenone, flecainide, encainide, and quinidine -certain medicines for migraine headache like almotriptan, eletriptan, frovatriptan, naratriptan, rizatriptan, sumatriptan, zolmitriptan -cimetidine -digoxin -diuretics -fentanyl -fosamprenavir -furazolidone -isoniazid -lithium -medicines that treat or prevent blood clots like warfarin, enoxaparin, and dalteparin -medicines for sleep -NSAIDs, medicines for pain and inflammation, like ibuprofen or naproxen -phenobarbital -phenytoin -procarbazine -rasagiline -ritonavir -supplements like St. John's wort, kava kava, valerian -tamoxifen -tramadol -tryptophan This list may not describe all possible interactions. Give your health care provider a list of all the medicines, herbs, non-prescription drugs, or dietary supplements you use. Also tell them if you smoke, drink alcohol, or use illegal drugs. Some items may interact with your medicine. What should I watch for while using this medicine? Tell your doctor if your symptoms do not get better or if they get worse. Visit your doctor or health care professional for regular checks on your progress. Because it may take several weeks to see the full effects of this medicine, it is important  to continue your treatment as prescribed by your doctor.  Patients and their families should watch out for new or worsening thoughts of suicide or depression. Also watch out for sudden changes in feelings such as feeling anxious, agitated, panicky, irritable, hostile, aggressive, impulsive, severely restless, overly excited and hyperactive, or not being able to sleep. If this happens, especially at the beginning of treatment or after a change in dose, call your health care professional. Dennis Bast may get drowsy or dizzy. Do not drive, use machinery, or do anything that needs mental alertness until you know how this medicine affects you. Do not stand or sit up quickly, especially if you are an older patient. This reduces the risk of dizzy or fainting spells. Alcohol may interfere with the effect of this medicine. Avoid alcoholic drinks. Your mouth may get dry. Chewing sugarless gum or sucking hard candy, and drinking plenty of water will help. Contact your doctor if the problem does not go away or is severe. What side effects may I notice from receiving this medicine? Side effects that you should report to your doctor or health care professional as soon as possible: -allergic reactions like skin rash, itching or hives, swelling of the face, lips, or tongue -anxious -black, tarry stools -changes in vision -confusion -elevated mood, decreased need for sleep, racing thoughts, impulsive behavior -eye pain -fast, irregular heartbeat -feeling faint or lightheaded, falls -feeling agitated, angry, or irritable -hallucination, loss of contact with reality -loss of balance or coordination -loss of memory -painful or prolonged erections -restlessness, pacing, inability to keep still -seizures -stiff muscles -suicidal thoughts or other mood changes -trouble sleeping -unusual bleeding or bruising -unusually weak or tired -vomiting Side effects that usually do not require medical attention (report to your  doctor or health care professional if they continue or are bothersome): -change in appetite or weight -change in sex drive or performance -diarrhea -dizziness -dry mouth -increased sweating -indigestion, nausea -tired -tremors This list may not describe all possible side effects. Call your doctor for medical advice about side effects. You may report side effects to FDA at 1-800-FDA-1088. Where should I keep my medicine? Keep out of the reach of children. Store at room temperature between 15 and 30 degrees C (59 and 86 degrees F). Keep container tightly closed. Throw away any unused medicine after the expiration date. NOTE: This sheet is a summary. It may not cover all possible information. If you have questions about this medicine, talk to your doctor, pharmacist, or health care provider.  2018 Elsevier/Gold Standard (2016-05-06 15:50:32)

## 2018-07-04 ENCOUNTER — Telehealth: Payer: Self-pay | Admitting: Obstetrics and Gynecology

## 2018-07-04 NOTE — Telephone Encounter (Signed)
Spoke with patient, advised as seen below per Dr. Silva.  Patient verbalizes understanding and is agreeable.  Encounter closed.  

## 2018-07-04 NOTE — Telephone Encounter (Signed)
Patient is having side effects from the anti depressant medication.

## 2018-07-04 NOTE — Telephone Encounter (Signed)
Spoke with patient. Increased Paxil to 10 mg today, reports diarrhea and lightheadedness 2-3 hrs after taking. Has been doing well with 5mg  dose, experienced dull HA first 2 days, this has resolved. Currently at work, denies any other symptoms.   Advised will review with Dr. Quincy Simmonds and return call, patient agreeable.   Dr. Quincy Simmonds -please advise.

## 2018-07-04 NOTE — Telephone Encounter (Signed)
Ok for Paxil 10 mg, take 1/2 pill (5 mg) daily.    Please keep appointment for 6 week visit.  If GI symptoms return, then stop medication and we will consider another option.

## 2018-08-09 ENCOUNTER — Telehealth: Payer: Self-pay | Admitting: Obstetrics and Gynecology

## 2018-08-09 ENCOUNTER — Ambulatory Visit: Payer: 59 | Admitting: Obstetrics and Gynecology

## 2018-08-09 NOTE — Telephone Encounter (Signed)
Patient canceled and rescheduled her aex due to the death of her grandmother.

## 2018-08-15 ENCOUNTER — Ambulatory Visit: Payer: BLUE CROSS/BLUE SHIELD | Admitting: Obstetrics and Gynecology

## 2018-09-05 NOTE — Progress Notes (Signed)
27 y.o. G65P0020 Single Caucasian female here for annual exam and recheck.  Received Rx for Loestrin 1.5/30 on 06/14/18 but did not start it yet.  Menses are regular.   Having abdominal pain and bloating.  Hx IBS. Sees GI.  Dr. Therisa Doyne with [facility].  She received Amitriptyline but decided to stop this.  Symptoms started when she stopped walking her dog was passed.  She was walking  3 -4 miles per day.   Waking up in the middle of night.  3 coffees per day.   No change in partner.  Declines STD testing.   Wanting to buy a house.  PCP:   Kristie Cowman, MD  Patient's last menstrual period was 08/22/2018.     Period Cycle (Days): 28 Period Duration (Days): 4-5 Period Pattern: Regular Menstrual Flow: Moderate Menstrual Control: Tampon, Maxi pad Menstrual Control Change Freq (Hours): states changes pad/tampon every 2-3 hours Dysmenorrhea: (!) Moderate Dysmenorrhea Symptoms: Cramping     Sexually active: Yes.    The current method of family planning is none.    Exercising: Yes.    cardio, yoga, srength Smoker:  no  Health Maintenance: Pap:  08/02/2016 negative History of abnormal Pap:  no MMG:  Ultrasound 08/14/2016 BI-RADS CATEGORY  2: Benign. Follow up at age 9 Colonoscopy:  n/a BMD:   n/a  Result  n/a TDaP:  UTD per patient Gardasil:   Yes- completed x3  HIV: 03-18-14 negative  Hep C: 03-18-14 negative  Screening Labs:  Hb today: discuss with provider, Urine today: not collected   reports that she has never smoked. She has never used smokeless tobacco. She reports that she drinks about 1.0 standard drinks of alcohol per week. She reports that she has current or past drug history. Drug: Marijuana.  Past Medical History:  Diagnosis Date  . Allergic rhinitis    followed by Dr Fredderick Phenix  . Anemia   . Anxiety   . Depression   . Fibroadenoma of left breast 2012   Dx by ultrasound.   . IBS (irritable bowel syndrome)   . STD (sexually transmitted disease) 08/2011   Tx'd for  Chlamydia    Past Surgical History:  Procedure Laterality Date  . DILATION AND CURETTAGE OF UTERUS  09-04-13   TAB    Current Outpatient Medications  Medication Sig Dispense Refill  . Norethindrone Acetate-Ethinyl Estradiol (LOESTRIN 1.5/30, 21,) 1.5-30 MG-MCG tablet Take 1 tablet by mouth daily. 3 Package 3   No current facility-administered medications for this visit.     Family History  Problem Relation Age of Onset  . Mental illness Mother   . Mental illness Maternal Grandmother   . Cancer Paternal Grandmother        skin cancer  . Thyroid disease Father   . Seizures Maternal Aunt     Review of Systems  Constitutional: Negative.   HENT: Negative.   Eyes: Negative.   Respiratory: Negative.   Cardiovascular: Negative.   Gastrointestinal: Positive for abdominal distention and abdominal pain.  Endocrine:       Craving sweets  Genitourinary:       Loss of sexual interest   Musculoskeletal: Negative.   Skin: Negative.   Allergic/Immunologic: Negative.   Neurological: Negative.   Hematological: Negative.   Psychiatric/Behavioral: Negative.   All other systems reviewed and are negative.   Exam:   BP (!) 90/54 (BP Location: Right Arm, Patient Position: Sitting, Cuff Size: Normal)   Pulse 72   Resp 14   Ht 5'  8.5" (1.74 m)   Wt 129 lb 8 oz (58.7 kg)   LMP 08/22/2018   BMI 19.40 kg/m     General appearance: alert, cooperative and appears stated age Head: Normocephalic, without obvious abnormality, atraumatic Neck: no adenopathy, supple, symmetrical, trachea midline and thyroid normal to inspection and palpation Lungs: clear to auscultation bilaterally Breasts: normal appearance, no masses or tenderness, No nipple retraction or dimpling, No nipple discharge or bleeding, No axillary or supraclavicular adenopathy Heart: regular rate and rhythm Abdomen: soft, non-tender; no masses, no organomegaly Extremities: extremities normal, atraumatic, no cyanosis or  edema Skin: Skin color, texture, turgor normal. No rashes or lesions Lymph nodes: Cervical, supraclavicular, and axillary nodes normal. No abnormal inguinal nodes palpated Neurologic: Grossly normal  Pelvic: External genitalia:  no lesions              Urethra:  normal appearing urethra with no masses, tenderness or lesions              Bartholins and Skenes: normal                 Vagina: normal appearing vagina with normal color and discharge, no lesions              Cervix: no lesions              Pap taken: No. Bimanual Exam:  Uterus:  normal size, contour, position, consistency, mobility, non-tender              Adnexa: no mass, fullness, tenderness     Chaperone was present for exam.  Assessment:   Well woman visit with normal exam. Abdominal bloating.   Plan: Mammogram screening. Recommended self breast awareness. Pap and HR HPV as above. Guidelines for Calcium, Vitamin D, regular exercise program including cardiovascular and weight bearing exercise. Start Loestrin 1.5/30.  Rx for one year.  No blood work needed.  We talked about increasing her activity and decreasing use of caffeine.  Follow up annually and prn.   After visit summary provided.

## 2018-09-06 ENCOUNTER — Other Ambulatory Visit: Payer: Self-pay

## 2018-09-06 ENCOUNTER — Ambulatory Visit (INDEPENDENT_AMBULATORY_CARE_PROVIDER_SITE_OTHER): Payer: 59 | Admitting: Obstetrics and Gynecology

## 2018-09-06 ENCOUNTER — Encounter: Payer: Self-pay | Admitting: Obstetrics and Gynecology

## 2018-09-06 VITALS — BP 90/54 | HR 72 | Resp 14 | Ht 68.5 in | Wt 129.5 lb

## 2018-09-06 DIAGNOSIS — Z01419 Encounter for gynecological examination (general) (routine) without abnormal findings: Secondary | ICD-10-CM

## 2018-09-06 MED ORDER — NORETHINDRONE ACET-ETHINYL EST 1.5-30 MG-MCG PO TABS: 1.0000 | ORAL_TABLET | Freq: Every day | ORAL | 3 refills | Status: DC

## 2018-09-06 NOTE — Patient Instructions (Signed)

## 2019-03-12 ENCOUNTER — Other Ambulatory Visit: Payer: Self-pay

## 2019-03-12 ENCOUNTER — Encounter (HOSPITAL_COMMUNITY): Payer: Self-pay | Admitting: Obstetrics and Gynecology

## 2019-03-12 ENCOUNTER — Emergency Department (HOSPITAL_COMMUNITY)
Admission: EM | Admit: 2019-03-12 | Discharge: 2019-03-12 | Disposition: A | Discharge status: Home / Self Care | Payer: 59 | Attending: Emergency Medicine | Admitting: Emergency Medicine

## 2019-03-12 DIAGNOSIS — Z79899 Other long term (current) drug therapy: Secondary | ICD-10-CM | POA: Diagnosis not present

## 2019-03-12 DIAGNOSIS — F419 Anxiety disorder, unspecified: Secondary | ICD-10-CM | POA: Insufficient documentation

## 2019-03-12 DIAGNOSIS — F329 Major depressive disorder, single episode, unspecified: Secondary | ICD-10-CM | POA: Insufficient documentation

## 2019-03-12 DIAGNOSIS — R1013 Epigastric pain: Secondary | ICD-10-CM | POA: Diagnosis not present

## 2019-03-12 DIAGNOSIS — R109 Unspecified abdominal pain: Secondary | ICD-10-CM

## 2019-03-12 LAB — COMPREHENSIVE METABOLIC PANEL
ALK PHOS: 56 U/L (ref 38–126)
ALT: 17 U/L (ref 0–44)
AST: 21 U/L (ref 15–41)
Albumin: 4.5 g/dL (ref 3.5–5.0)
Anion gap: 7 (ref 5–15)
BUN: 11 mg/dL (ref 6–20)
CO2: 23 mmol/L (ref 22–32)
Calcium: 9.2 mg/dL (ref 8.9–10.3)
Chloride: 108 mmol/L (ref 98–111)
Creatinine, Ser: 0.69 mg/dL (ref 0.44–1.00)
GFR calc non Af Amer: >60 mL/min (ref >60)
Glucose, Bld: 96 mg/dL (ref 70–99)
POTASSIUM: 3.6 mmol/L (ref 3.5–5.1)
Sodium: 138 mmol/L (ref 135–145)
Total Bilirubin: 1 mg/dL (ref 0.3–1.2)
Total Protein: 7.4 g/dL (ref 6.5–8.1)

## 2019-03-12 LAB — CBC
HEMATOCRIT: 40.9 % (ref 36.0–46.0)
HEMOGLOBIN: 12.7 g/dL (ref 12.0–15.0)
MCH: 27.4 pg (ref 26.0–34.0)
MCHC: 31.1 g/dL (ref 30.0–36.0)
MCV: 88.1 fL (ref 80.0–100.0)
Platelets: 298 10*3/uL (ref 150–400)
RBC: 4.64 MIL/uL (ref 3.87–5.11)
RDW: 13.2 % (ref 11.5–15.5)
WBC: 9.1 10*3/uL (ref 4.0–10.5)
nRBC: 0 % (ref 0.0–0.2)

## 2019-03-12 LAB — URINALYSIS, ROUTINE W REFLEX MICROSCOPIC
Bilirubin Urine: NEGATIVE
GLUCOSE, UA: NEGATIVE mg/dL
Hgb urine dipstick: NEGATIVE
Ketones, ur: 20 mg/dL — AB
Leukocytes,Ua: NEGATIVE
Nitrite: NEGATIVE
PH: 7 (ref 5.0–8.0)
Protein, ur: NEGATIVE mg/dL
Specific Gravity, Urine: 1.014 (ref 1.005–1.030)

## 2019-03-12 LAB — LIPASE, BLOOD: Lipase: 21 U/L (ref 11–51)

## 2019-03-12 MED ORDER — ONDANSETRON HCL 4 MG/2ML IJ SOLN: 4.0000 mg | Freq: Once | INTRAMUSCULAR | Status: DC

## 2019-03-12 MED ORDER — ALUM & MAG HYDROXIDE-SIMETH 200-200-20 MG/5ML PO SUSP
30.0000 mL | Freq: Once | ORAL | Status: AC
  Administered 2019-03-12: 30 mL via ORAL
  Filled 2019-03-12: qty 30

## 2019-03-12 MED ORDER — DICYCLOMINE HCL 10 MG PO CAPS
10.0000 mg | ORAL_CAPSULE | Freq: Once | ORAL | Status: AC
  Administered 2019-03-12: 10 mg via ORAL
  Filled 2019-03-12: qty 1

## 2019-03-12 MED ORDER — SODIUM CHLORIDE 0.9% FLUSH
3.0000 mL | Freq: Once | INTRAVENOUS | Status: AC
  Administered 2019-03-12: 3 mL via INTRAVENOUS

## 2019-03-12 NOTE — ED Provider Notes (Signed)
[facility] DEPT Provider Note   CSN: [id number] Arrival date & time: 03/12/19  9562    History   Chief Complaint Chief Complaint  Patient presents with  . Abdominal Pain    HPI Colleen Diaz is a 28 y.o. female.     28 year old female with past medical history including IBS, anxiety/depression, anemia who presents with abdominal pain.  Around 6 AM this morning, she had relatively sudden onset of central, burning and aching, constant abdominal pain.  She was evaluated at a clinic and referred here for further evaluation.  UA and urine pregnancy test were normal at outside facility.  She was given Zofran and Toradol prior to transfer which has somewhat improved her pain.  She states that pain sometimes feels better when she stands up and sometimes when she is moving around.  No radiation to her back.  She has had some nausea but no associated vomiting, fevers, URI symptoms, urinary symptoms, or vaginal bleeding/discharge.  She has had a loose, nonbloody stool this morning.  She reports history of IBS which has sometimes resulted in pain that has doubled her over but she states that this pain feels different.  No sick contacts or recent travel. Denies EtOH or heavy NSAID use.  The history is provided by the patient.  Abdominal Pain    Past Medical History:  Diagnosis Date  . Allergic rhinitis    followed by Dr Fredderick Phenix  . Anemia   . Anxiety   . Depression   . Fibroadenoma of left breast 2012   Dx by ultrasound.   . IBS (irritable bowel syndrome)   . STD (sexually transmitted disease) 08/2011   Tx'd for Chlamydia    Patient Active Problem List   Diagnosis Date Noted  . Anxiety 06/28/2018    Past Surgical History:  Procedure Laterality Date  . DILATION AND CURETTAGE OF UTERUS  09-04-13   TAB     OB History    Gravida  2   Para      Term      Preterm      AB  2   Living        SAB  1   TAB  1   Ectopic      Multiple      Live Births               Home Medications    Prior to Admission medications   Medication Sig Start Date End Date Taking? Authorizing Provider  Ascorbic Acid (VITAMIN C PO) Take 1 tablet by mouth daily.   Yes [provider]  ascorbic acid (VITAMIN C) 500 MG tablet Take 500 mg by mouth daily.   Yes [provider]  ASHWAGANDHA PO Take 1 tablet by mouth daily.   Yes [provider]  cholecalciferol (VITAMIN D) 25 MCG (1000 UT) tablet Take 1,000 Units by mouth daily.   Yes [provider]  Multiple Vitamin (MULTIVITAMIN WITH MINERALS) TABS tablet Take 1 tablet by mouth daily.   Yes [provider]  Norethindrone Acetate-Ethinyl Estradiol (LOESTRIN 1.5/30, 21,) 1.5-30 MG-MCG tablet Take 1 tablet by mouth daily. Patient not taking: Reported on 03/12/2019 09/06/18   Nunzio Cobbs, MD    Family History Family History  Problem Relation Age of Onset  . Mental illness Mother   . Mental illness Maternal Grandmother   . Cancer Paternal Grandmother        skin cancer  .  Thyroid disease Father   . Seizures Maternal Aunt     Social History Social History   Tobacco Use  . Smoking status: Never Smoker  . Smokeless tobacco: Never Used  Substance Use Topics  . Alcohol use: Yes    Alcohol/week: 1.0 standard drinks    Types: 1 Glasses of wine per week    Comment: social  . Drug use: Yes    Types: Marijuana    Comment: occ     Allergies   Doxycycline and Hydrocodone-acetaminophen   Review of Systems Review of Systems  Gastrointestinal: Positive for abdominal pain.   All other systems reviewed and are negative except that which was mentioned in HPI   Physical Exam Updated Vital Signs BP 127/80 (BP Location: Left Arm)   Pulse 67   Temp 98.7 F (37.1 C) (Oral)   Resp 14   Ht 5\' 8"  (1.727 m)   Wt 59 kg   LMP 02/19/2019 (Approximate)   SpO2 100%   BMI 19.77 kg/m   Physical Exam Vitals signs and nursing note  reviewed.  Constitutional:      General: She is not in acute distress.    Appearance: She is well-developed.  HENT:     Head: Normocephalic and atraumatic.  Eyes:     Conjunctiva/sclera: Conjunctivae normal.  Neck:     Musculoskeletal: Neck supple.  Cardiovascular:     Rate and Rhythm: Normal rate and regular rhythm.     Heart sounds: Normal heart sounds. No murmur.  Pulmonary:     Effort: Pulmonary effort is normal.     Breath sounds: Normal breath sounds.  Abdominal:     General: Bowel sounds are normal. There is no distension.     Palpations: Abdomen is soft.     Tenderness: There is abdominal tenderness in the epigastric area. There is no guarding or rebound.     Comments: Very mild epigastric tenderness to deep palpation  Skin:    General: Skin is warm and dry.  Neurological:     Mental Status: She is alert and oriented to person, place, and time.     Comments: Fluent speech  Psychiatric:        Mood and Affect: Mood is anxious.        Judgment: Judgment normal.      ED Treatments / Results  Labs (all labs ordered are listed, but only abnormal results are displayed) Labs Reviewed  URINALYSIS, ROUTINE W REFLEX MICROSCOPIC - Abnormal; Notable for the following components:      Result Value   Ketones, ur 20 (*)    All other components within normal limits  LIPASE, BLOOD  COMPREHENSIVE METABOLIC PANEL  CBC    EKG None  Radiology No results found.  Procedures Procedures (including critical care time)  Medications Ordered in ED Medications  sodium chloride flush (NS) 0.9 % injection 3 mL (3 mLs Intravenous Given 03/12/19 1011)  alum & mag hydroxide-simeth (MAALOX/MYLANTA) 200-200-20 MG/5ML suspension 30 mL (30 mLs Oral Given 03/12/19 1028)  dicyclomine (BENTYL) capsule 10 mg (10 mg Oral Given 03/12/19 1026)     Initial Impression / Assessment and Plan / ED Course  I have reviewed the triage vital signs and the nursing notes.  Pertinent labs & imaging  results that were available during my care of the patient were reviewed by me and considered in my medical decision making (see chart for details).        Well-appearing on exam, normal vital signs, afebrile.  She had no lower abdominal tenderness on exam.  Her symptoms are not highly suggestive of appendicitis and her exam is not consistent with appendicitis.  Doubt PID or ovarian pathology. No sx suggestive of bowel obstruction. UA normal making pyelo or kidney stone very unlikely.  All lab work including LFTs and lipase, CMP, CBC unremarkable.  Based on no lower abdominal tenderness, I feel appendicitis is extremely unlikely.  This reason, will forego CT scan at this time given her young age and radiation exposure risk.  She has been able to drink and eat crackers, states that her abdominal pain is currently resolved.  I have discussed supportive measures and emphasized return precautions including any worsening pain, severe vomiting, or fever.  She voiced understanding.  Final Clinical Impressions(s) / ED Diagnoses   Final diagnoses:  Abdominal pain, unspecified abdominal location    ED Discharge Orders    None       Devlon Dosher, Wenda Overland, MD 03/12/19 1208

## 2019-03-12 NOTE — ED Triage Notes (Signed)
Patient was sent from [facility] center for abdominal pain and low grade fever. Pt reports central abdominal pain that radiates up. Pt reports she is not pregnant with last period at the beginning of the month.  Pt denies emesis but reports 1 episode of loose stool and some nausea.

## 2019-03-12 NOTE — ED Notes (Signed)
Pt given water and saltine crackers for po challenge.

## 2019-06-30 ENCOUNTER — Other Ambulatory Visit: Payer: Self-pay | Admitting: Obstetrics and Gynecology

## 2019-06-30 MED ORDER — NORETHINDRONE ACET-ETHINYL EST 1.5-30 MG-MCG PO TABS: 1.0000 | ORAL_TABLET | Freq: Every day | ORAL | 0 refills | Status: DC

## 2019-06-30 NOTE — Telephone Encounter (Signed)
Patient requesting a prescription for generic loloestrin. [facility] at [phone].

## 2019-06-30 NOTE — Telephone Encounter (Signed)
Medication refill request: Loloestrin  Last AEX:  09/06/18 Next AEX: 09/19/19 Last MMG (if hormonal medication request): NA Refill authorized: #3 with 0 RF

## 2019-08-22 ENCOUNTER — Telehealth: Payer: Self-pay | Admitting: Obstetrics and Gynecology

## 2019-08-22 NOTE — Telephone Encounter (Signed)
Left message on voicemail to call and reschedule cancelled appointment. °

## 2019-09-19 ENCOUNTER — Ambulatory Visit: Payer: 59 | Admitting: Obstetrics and Gynecology

## 2019-11-11 ENCOUNTER — Other Ambulatory Visit: Payer: Self-pay | Admitting: Obstetrics and Gynecology

## 2019-11-11 NOTE — Telephone Encounter (Signed)
Medication refill request: Junel  Last AEX:  09/06/18 Next AEX: none scheduled  Last MMG (if hormonal medication request): NA Refill authorized: 21 with no refills pended Note sent to pharmacy to inform patient to call to schedule her AEX.

## 2019-11-11 NOTE — Telephone Encounter (Signed)
Spoke with pt. Pt made aware of Junel  Rx approval. Pt scheduled for AEX with Dr Quincy Simmonds on 12/01/19 at 9:00am. Pt agreeable.   Will route to Dr Quincy Simmonds for review and close encounter.

## 2019-11-11 NOTE — Telephone Encounter (Signed)
Please contact patient to schedule her annual exam with me.

## 2019-12-01 ENCOUNTER — Ambulatory Visit: Payer: Self-pay | Admitting: Obstetrics and Gynecology

## 2020-01-07 NOTE — Progress Notes (Deleted)
29 y.o. G58P0020 Single Caucasian female here for annual exam.    PCP:     No LMP recorded.           Sexually active: {yes no:314532}  The current method of family planning is {contraception:315051}.    Exercising: {yes no:314532}  {types:19826} Smoker:  no  Health Maintenance: Pap: 08-02-16 Neg, 11-24-13 Neg History of abnormal Pap:  {YES NO:22349} MMG: 08-14-16 Lt.Br.US/stable prob.fibroadenoma Lt.Br./screening age 39/BiRads2 Colonoscopy:  n/a BMD:   n/a  Result  n/a TDaP:  *** Gardasil:   Yes, completed HIV:03-18-14 NR Hep C: 03-18-14 Neg Screening Labs:  Hb today: ***, Urine today: ***   reports that she has never smoked. She has never used smokeless tobacco. She reports current alcohol use of about 1.0 standard drinks of alcohol per week. She reports current drug use. Drug: Marijuana.  Past Medical History:  Diagnosis Date  . Allergic rhinitis    followed by Dr Fredderick Phenix  . Anemia   . Anxiety   . Depression   . Fibroadenoma of left breast 2012   Dx by ultrasound.   . IBS (irritable bowel syndrome)   . STD (sexually transmitted disease) 08/2011   Tx'd for Chlamydia    Past Surgical History:  Procedure Laterality Date  . DILATION AND CURETTAGE OF UTERUS  09-04-13   TAB    Current Outpatient Medications  Medication Sig Dispense Refill  . Ascorbic Acid (VITAMIN C PO) Take 1 tablet by mouth daily.    Marland Kitchen ascorbic acid (VITAMIN C) 500 MG tablet Take 500 mg by mouth daily.    . ASHWAGANDHA PO Take 1 tablet by mouth daily.    . cholecalciferol (VITAMIN D) 25 MCG (1000 UT) tablet Take 1,000 Units by mouth daily.    Lenda Kelp 1.5/30 1.5-30 MG-MCG tablet TAKE 1 TABLET BY MOUTH DAILY 84 tablet 0  . Multiple Vitamin (MULTIVITAMIN WITH MINERALS) TABS tablet Take 1 tablet by mouth daily.     No current facility-administered medications for this visit.    Family History  Problem Relation Age of Onset  . Mental illness Mother   . Mental illness Maternal Grandmother   . Cancer  Paternal Grandmother        skin cancer  . Thyroid disease Father   . Seizures Maternal Aunt     Review of Systems  Exam:   There were no vitals taken for this visit.    General appearance: alert, cooperative and appears stated age Head: normocephalic, without obvious abnormality, atraumatic Neck: no adenopathy, supple, symmetrical, trachea midline and thyroid normal to inspection and palpation Lungs: clear to auscultation bilaterally Breasts: normal appearance, no masses or tenderness, No nipple retraction or dimpling, No nipple discharge or bleeding, No axillary adenopathy Heart: regular rate and rhythm Abdomen: soft, non-tender; no masses, no organomegaly Extremities: extremities normal, atraumatic, no cyanosis or edema Skin: skin color, texture, turgor normal. No rashes or lesions Lymph nodes: cervical, supraclavicular, and axillary nodes normal. Neurologic: grossly normal  Pelvic: External genitalia:  no lesions              No abnormal inguinal nodes palpated.              Urethra:  normal appearing urethra with no masses, tenderness or lesions              Bartholins and Skenes: normal                 Vagina: normal appearing vagina with normal color  and discharge, no lesions              Cervix: no lesions              Pap taken: {yes no:314532} Bimanual Exam:  Uterus:  normal size, contour, position, consistency, mobility, non-tender              Adnexa: no mass, fullness, tenderness              Rectal exam: {yes no:314532}.  Confirms.              Anus:  normal sphincter tone, no lesions  Chaperone was present for exam.  Assessment:   Well woman visit with normal exam.   Plan: Mammogram screening discussed. Self breast awareness reviewed. Pap and HR HPV as above. Guidelines for Calcium, Vitamin D, regular exercise program including cardiovascular and weight bearing exercise.   Follow up annually and prn.   Additional counseling given.  {yes  [id number]. _______ minutes face to face time of which over 50% was spent in counseling.    After visit summary provided.

## 2020-01-08 ENCOUNTER — Ambulatory Visit: Payer: Self-pay | Admitting: Obstetrics and Gynecology

## 2020-01-12 NOTE — Progress Notes (Deleted)
29 y.o. G47P0020 Single Caucasian female here for annual exam.    PCP:     No LMP recorded.           Sexually active: {yes no:314532}  The current method of family planning is {contraception:315051}.    Exercising: {yes no:314532}  {types:19826} Smoker:  no  Health Maintenance: Pap: 08/02/2016 Neg, 11-24-13 Neg History of abnormal Pap:  {YES NO:22349} MMG: 08-14-16 Lt.Br.US/Stable probable fibroadenoma left breast/screening age 43/BiRads2 Colonoscopy:  *** BMD:   ***  Result  *** TDaP:  *** Gardasil:   {YES NO:22349} HIV:03-18-14 NR Hep C: 03-18-14 Neg Screening Labs:  Hb today: ***, Urine today: ***   reports that she has never smoked. She has never used smokeless tobacco. She reports current alcohol use of about 1.0 standard drinks of alcohol per week. She reports current drug use. Drug: Marijuana.  Past Medical History:  Diagnosis Date  . Allergic rhinitis    followed by Dr Fredderick Phenix  . Anemia   . Anxiety   . Depression   . Fibroadenoma of left breast 2012   Dx by ultrasound.   . IBS (irritable bowel syndrome)   . STD (sexually transmitted disease) 08/2011   Tx'd for Chlamydia    Past Surgical History:  Procedure Laterality Date  . DILATION AND CURETTAGE OF UTERUS  09-04-13   TAB    Current Outpatient Medications  Medication Sig Dispense Refill  . Ascorbic Acid (VITAMIN C PO) Take 1 tablet by mouth daily.    Marland Kitchen ascorbic acid (VITAMIN C) 500 MG tablet Take 500 mg by mouth daily.    . ASHWAGANDHA PO Take 1 tablet by mouth daily.    . cholecalciferol (VITAMIN D) 25 MCG (1000 UT) tablet Take 1,000 Units by mouth daily.    Lenda Kelp 1.5/30 1.5-30 MG-MCG tablet TAKE 1 TABLET BY MOUTH DAILY 84 tablet 0  . Multiple Vitamin (MULTIVITAMIN WITH MINERALS) TABS tablet Take 1 tablet by mouth daily.     No current facility-administered medications for this visit.    Family History  Problem Relation Age of Onset  . Mental illness Mother   . Mental illness Maternal Grandmother    . Cancer Paternal Grandmother        skin cancer  . Thyroid disease Father   . Seizures Maternal Aunt     Review of Systems  Exam:   There were no vitals taken for this visit.    General appearance: alert, cooperative and appears stated age Head: normocephalic, without obvious abnormality, atraumatic Neck: no adenopathy, supple, symmetrical, trachea midline and thyroid normal to inspection and palpation Lungs: clear to auscultation bilaterally Breasts: normal appearance, no masses or tenderness, No nipple retraction or dimpling, No nipple discharge or bleeding, No axillary adenopathy Heart: regular rate and rhythm Abdomen: soft, non-tender; no masses, no organomegaly Extremities: extremities normal, atraumatic, no cyanosis or edema Skin: skin color, texture, turgor normal. No rashes or lesions Lymph nodes: cervical, supraclavicular, and axillary nodes normal. Neurologic: grossly normal  Pelvic: External genitalia:  no lesions              No abnormal inguinal nodes palpated.              Urethra:  normal appearing urethra with no masses, tenderness or lesions              Bartholins and Skenes: normal                 Vagina: normal appearing vagina with  normal color and discharge, no lesions              Cervix: no lesions              Pap taken: {yes no:314532} Bimanual Exam:  Uterus:  normal size, contour, position, consistency, mobility, non-tender              Adnexa: no mass, fullness, tenderness              Rectal exam: {yes no:314532}.  Confirms.              Anus:  normal sphincter tone, no lesions  Chaperone was present for exam.  Assessment:   Well woman visit with normal exam.   Plan: Mammogram screening discussed. Self breast awareness reviewed. Pap and HR HPV as above. Guidelines for Calcium, Vitamin D, regular exercise program including cardiovascular and weight bearing exercise.   Follow up annually and prn.   Additional counseling given.  {yes  [id number]. _______ minutes face to face time of which over 50% was spent in counseling.    After visit summary provided.

## 2020-01-13 ENCOUNTER — Ambulatory Visit: Payer: Self-pay | Admitting: Obstetrics and Gynecology

## 2020-01-13 ENCOUNTER — Encounter: Payer: Self-pay | Admitting: Obstetrics and Gynecology

## 2020-01-19 ENCOUNTER — Telehealth: Payer: Self-pay | Admitting: Obstetrics and Gynecology

## 2020-01-19 ENCOUNTER — Ambulatory Visit: Payer: Self-pay | Admitting: Obstetrics and Gynecology

## 2020-01-19 ENCOUNTER — Ambulatory Visit (INDEPENDENT_AMBULATORY_CARE_PROVIDER_SITE_OTHER): Payer: 59 | Admitting: Obstetrics and Gynecology

## 2020-01-19 ENCOUNTER — Other Ambulatory Visit: Payer: Self-pay

## 2020-01-19 ENCOUNTER — Encounter: Payer: Self-pay | Admitting: Obstetrics and Gynecology

## 2020-01-19 VITALS — BP 122/70 | HR 70 | Temp 98.2°F | Ht 68.5 in | Wt 132.0 lb

## 2020-01-19 DIAGNOSIS — N926 Irregular menstruation, unspecified: Secondary | ICD-10-CM | POA: Diagnosis not present

## 2020-01-19 DIAGNOSIS — R319 Hematuria, unspecified: Secondary | ICD-10-CM

## 2020-01-19 LAB — POCT URINALYSIS DIPSTICK
Bilirubin, UA: NEGATIVE
Blood, UA: NEGATIVE
Glucose, UA: NEGATIVE
Ketones, UA: NEGATIVE
Leukocytes, UA: NEGATIVE
Nitrite, UA: NEGATIVE
Protein, UA: NEGATIVE
Urobilinogen, UA: 0.2 E.U./dL
pH, UA: 5 (ref 5.0–8.0)

## 2020-01-19 LAB — BETA HCG QUANT (REF LAB): hCG Quant: 29 m[IU]/mL

## 2020-01-19 LAB — POCT URINE PREGNANCY: Preg Test, Ur: NEGATIVE

## 2020-01-19 NOTE — Telephone Encounter (Signed)
Spoke with patient, advised per Dr. Quincy Simmonds. Patient will come to office now, will be worked into schedule.  Routing to provider for final review. Patient is agreeable to disposition. Will close encounter.

## 2020-01-19 NOTE — Telephone Encounter (Signed)
Spoke with patient. Patient reports she took 2 UPT a few days ago, positive. LMP 12/18/19. Patient reports mild menses like cramps 3/10 and a "pulling sensation" in lower abdomen/pelvis. Reports spotting when voiding and nausea, no vomiting. Patient has Hx of miscarriage, requesting OV. Denies heavy bleeding or vomiting. [id number] prescreen negative.   OV scheduled for today at 1:45pm with Dr. Quincy Simmonds. Advised I will review with Dr. Quincy Simmonds and return call if any additional recommendations, patient agreeable.   Last AEX 09/06/18 Next AEX 01/22/20  Routing to Dr. Quincy Simmonds.

## 2020-01-19 NOTE — Telephone Encounter (Signed)
Left message to call Jaedon Siler, RN at [facility] [phone].   

## 2020-01-19 NOTE — Progress Notes (Signed)
GYNECOLOGY  VISIT   HPI: 29 y.o.   Single  Caucasian  female   G2P0020 with Patient's last menstrual period was 12/18/2019 (exact date).   here for missed menses with positive home UPT 3 days ago.   Patient reports spotting and mild cramping in the early in.  Cramping is midline.   Having some brown blood?  Forgetting her birth control.  Her partner will start residency in cardiology in [place] in [place].  She works for [employer].   UPT negative.   Her blood type is O positive.   GYNECOLOGIC HISTORY: Patient's last menstrual period was 12/18/2019 (exact date). Contraception: LoLoestrin--missed some pills--stopped now Menopausal hormone therapy: 08-14-16 LT.Br.US--Stable probable fibroadenoma left breast/screening age 23/BiRads2 Last mammogram:  n/a Last pap smear:  08/02/2016 Neg, 11-24-13 Neg        OB History    Gravida  2   Para      Term      Preterm      AB  2   Living        SAB  1   TAB  1   Ectopic      Multiple      Live Births                 Patient Active Problem List   Diagnosis Date Noted  . Anxiety 06/28/2018    Past Medical History:  Diagnosis Date  . Allergic rhinitis    followed by Dr Fredderick Phenix  . Anemia   . Anxiety   . Depression   . Fibroadenoma of left breast 2012   Dx by ultrasound.   . IBS (irritable bowel syndrome)   . STD (sexually transmitted disease) 08/2011   Tx'd for Chlamydia    Past Surgical History:  Procedure Laterality Date  . DILATION AND CURETTAGE OF UTERUS  09-04-13   TAB    Current Outpatient Medications  Medication Sig Dispense Refill  . ascorbic acid (VITAMIN C) 500 MG tablet Take 500 mg by mouth daily.    . cholecalciferol (VITAMIN D) 25 MCG (1000 UT) tablet Take 1,000 Units by mouth daily.    Lenda Kelp 1.5/30 1.5-30 MG-MCG tablet TAKE 1 TABLET BY MOUTH DAILY 84 tablet 0  . Multiple Vitamin (MULTIVITAMIN WITH MINERALS) TABS tablet Take 1 tablet by mouth daily.    Marland Kitchen zinc gluconate 50 MG tablet  Take 50 mg by mouth daily.     No current facility-administered medications for this visit.     ALLERGIES: Doxycycline and Hydrocodone-acetaminophen  Family History  Problem Relation Age of Onset  . Mental illness Mother   . Mental illness Maternal Grandmother   . Cancer Paternal Grandmother        skin cancer  . Thyroid disease Father   . Seizures Maternal Aunt     Social History   Socioeconomic History  . Marital status: Single    Spouse name: Not on file  . Number of children: Not on file  . Years of education: Not on file  . Highest education level: Not on file  Occupational History  . Not on file  Tobacco Use  . Smoking status: Never Smoker  . Smokeless tobacco: Never Used  Substance and Sexual Activity  . Alcohol use: Yes    Alcohol/week: 1.0 standard drinks    Types: 1 Glasses of wine per week    Comment: social  . Drug use: Yes    Types: Marijuana    Comment:  occ  . Sexual activity: Not Currently    Partners: Male    Birth control/protection: None  Other Topics Concern  . Not on file  Social History Narrative  . Not on file   Social Determinants of Health   Financial Resource Strain:   . Difficulty of Paying Living Expenses: Not on file  Food Insecurity:   . Worried About Charity fundraiser in the Last Year: Not on file  . Ran Out of Food in the Last Year: Not on file  Transportation Needs:   . Lack of Transportation (Medical): Not on file  . Lack of Transportation (Non-Medical): Not on file  Physical Activity:   . Days of Exercise per Week: Not on file  . Minutes of Exercise per Session: Not on file  Stress:   . Feeling of Stress : Not on file  Social Connections:   . Frequency of Communication with Friends and Family: Not on file  . Frequency of Social Gatherings with Friends and Family: Not on file  . Attends Religious Services: Not on file  . Active Member of Clubs or Organizations: Not on file  . Attends Archivist Meetings:  Not on file  . Marital Status: Not on file  Intimate Partner Violence:   . Fear of Current or Ex-Partner: Not on file  . Emotionally Abused: Not on file  . Physically Abused: Not on file  . Sexually Abused: Not on file    Review of Systems  All other systems reviewed and are negative.   PHYSICAL EXAMINATION:    BP 122/70   Pulse 70   Temp 98.2 F (36.8 C) (Temporal)   Ht 5' 8.5" (1.74 m)   Wt 132 lb (59.9 kg)   LMP 12/18/2019 (Exact Date)   BMI 19.78 kg/m     General appearance: alert, cooperative and appears stated age  Pelvic: External genitalia:  no lesions              Urethra:  normal appearing urethra with no masses, tenderness or lesions              Bartholins and Skenes: normal                 Vagina: normal appearing vagina with small amount of light brown blood.               Cervix: no lesions                Bimanual Exam:  Uterus:  normal size, contour, position, consistency, mobility, non-tender              Adnexa: no mass, fullness, tenderness               Chaperone was present for exam.  ASSESSMENT  Missed menses.   PLAN  We discussed bleeding in early pregnancy.  Stat hCG. We reviewed taking PNV, avoidance of exposures to tobacco/ETOH/uncooked or unprepared food/litter box.  I recommend a flu vaccine.    An After Visit Summary was printed and given to the patient.  __20____ minutes face to face time of which over 50% was spent in counseling.

## 2020-01-19 NOTE — Telephone Encounter (Signed)
Patient is pregnant and having pain.

## 2020-01-19 NOTE — Telephone Encounter (Signed)
Please have patient come in to the office now to be worked in.

## 2020-01-21 ENCOUNTER — Other Ambulatory Visit: Payer: Self-pay

## 2020-01-21 ENCOUNTER — Encounter: Payer: Self-pay | Admitting: Obstetrics and Gynecology

## 2020-01-21 ENCOUNTER — Ambulatory Visit (INDEPENDENT_AMBULATORY_CARE_PROVIDER_SITE_OTHER): Payer: 59 | Admitting: Obstetrics and Gynecology

## 2020-01-21 VITALS — BP 110/64 | HR 60 | Temp 98.1°F | Ht 68.5 in | Wt 132.0 lb

## 2020-01-21 DIAGNOSIS — N926 Irregular menstruation, unspecified: Secondary | ICD-10-CM | POA: Diagnosis not present

## 2020-01-21 DIAGNOSIS — Z8759 Personal history of other complications of pregnancy, childbirth and the puerperium: Secondary | ICD-10-CM | POA: Diagnosis not present

## 2020-01-21 DIAGNOSIS — O209 Hemorrhage in early pregnancy, unspecified: Secondary | ICD-10-CM | POA: Diagnosis not present

## 2020-01-21 LAB — BETA HCG QUANT (REF LAB): hCG Quant: 8 m[IU]/mL

## 2020-01-21 NOTE — Progress Notes (Signed)
Beta Hcg orders placed for pt coming for lab on 01/29/2020.

## 2020-01-21 NOTE — Progress Notes (Signed)
GYNECOLOGY  VISIT   HPI: 29 y.o.   Single  Caucasian  female   G2P0020 with Patient's last menstrual period was 12/18/2019 (exact date).   here for follow up.  Her hCG was 29 two days ago.   Patient has had some spotting and began cramping bilateral lower abdomen today. Still having dark red blood.    BT - O positive.  GYNECOLOGIC HISTORY: Patient's last menstrual period was 12/18/2019 (exact date). Contraception: none Menopausal hormone therapy:  n/a Last mammogram:  n/a Last pap smear:  08/02/2016 Neg, 11-24-13 Neg        OB History    Gravida  2   Para      Term      Preterm      AB  2   Living        SAB  1   TAB  1   Ectopic      Multiple      Live Births                 Patient Active Problem List   Diagnosis Date Noted  . Anxiety 06/28/2018    Past Medical History:  Diagnosis Date  . Allergic rhinitis    followed by Dr Fredderick Phenix  . Anemia   . Anxiety   . Depression   . Fibroadenoma of left breast 2012   Dx by ultrasound.   . IBS (irritable bowel syndrome)   . STD (sexually transmitted disease) 08/2011   Tx'd for Chlamydia    Past Surgical History:  Procedure Laterality Date  . DILATION AND CURETTAGE OF UTERUS  09-04-13   TAB    Current Outpatient Medications  Medication Sig Dispense Refill  . ascorbic acid (VITAMIN C) 500 MG tablet Take 500 mg by mouth daily.    . cholecalciferol (VITAMIN D) 25 MCG (1000 UT) tablet Take 1,000 Units by mouth daily.    . Multiple Vitamin (MULTIVITAMIN WITH MINERALS) TABS tablet Take 1 tablet by mouth daily.    Marland Kitchen zinc gluconate 50 MG tablet Take 50 mg by mouth daily.     No current facility-administered medications for this visit.     ALLERGIES: Doxycycline and Hydrocodone-acetaminophen  Family History  Problem Relation Age of Onset  . Mental illness Mother   . Mental illness Maternal Grandmother   . Cancer Paternal Grandmother        skin cancer  . Thyroid disease Father   . Seizures  Maternal Aunt     Social History   Socioeconomic History  . Marital status: Single    Spouse name: Not on file  . Number of children: Not on file  . Years of education: Not on file  . Highest education level: Not on file  Occupational History  . Not on file  Tobacco Use  . Smoking status: Never Smoker  . Smokeless tobacco: Never Used  Substance and Sexual Activity  . Alcohol use: Yes    Alcohol/week: 1.0 standard drinks    Types: 1 Glasses of wine per week    Comment: social  . Drug use: Yes    Types: Marijuana    Comment: occ  . Sexual activity: Not Currently    Partners: Male    Birth control/protection: None  Other Topics Concern  . Not on file  Social History Narrative  . Not on file   Social Determinants of Health   Financial Resource Strain:   . Difficulty of Paying Living Expenses: Not on  file  Food Insecurity:   . Worried About Charity fundraiser in the Last Year: Not on file  . Ran Out of Food in the Last Year: Not on file  Transportation Needs:   . Lack of Transportation (Medical): Not on file  . Lack of Transportation (Non-Medical): Not on file  Physical Activity:   . Days of Exercise per Week: Not on file  . Minutes of Exercise per Session: Not on file  Stress:   . Feeling of Stress : Not on file  Social Connections:   . Frequency of Communication with Friends and Family: Not on file  . Frequency of Social Gatherings with Friends and Family: Not on file  . Attends Religious Services: Not on file  . Active Member of Clubs or Organizations: Not on file  . Attends Archivist Meetings: Not on file  . Marital Status: Not on file  Intimate Partner Violence:   . Fear of Current or Ex-Partner: Not on file  . Emotionally Abused: Not on file  . Physically Abused: Not on file  . Sexually Abused: Not on file    Review of Systems  All other systems reviewed and are negative.   PHYSICAL EXAMINATION:    BP 110/64   Pulse 60   Temp 98.1 F  (36.7 C) (Temporal)   Ht 5' 8.5" (1.74 m)   Wt 132 lb (59.9 kg)   LMP 12/18/2019 (Exact Date)   BMI 19.78 kg/m     General appearance: alert, cooperative and appears stated age   Abdomen: soft, non-tender, nontender.   Pelvic: External genitalia:  no lesions              Urethra:  normal appearing urethra with no masses, tenderness or lesions              Bartholins and Skenes: normal                 Vagina: red blood in the vagina.               Cervix: no lesions                Bimanual Exam:  Uterus:  normal size, contour, position, consistency, mobility, non-tender              Adnexa: no mass, fullness, tenderness           Chaperone was present for exam.  ASSESSMENT  Bleeding in early pregnancy.  Hx prior early miscarriage.  Different partner.  Hx chlamydia.   PLAN  We discussed potential miscarriage and causes for this.  We reviewed work up for recurrent miscarriage, but this is a different partner.  I am not recommending this at this time.  Check hCG STAT.   An After Visit Summary was printed and given to the patient.  ___20___ minutes face to face time of which over 50% was spent in counseling.

## 2020-01-22 ENCOUNTER — Ambulatory Visit: Payer: Self-pay | Admitting: Obstetrics and Gynecology

## 2020-01-28 ENCOUNTER — Other Ambulatory Visit: Payer: Self-pay

## 2020-01-28 ENCOUNTER — Other Ambulatory Visit (INDEPENDENT_AMBULATORY_CARE_PROVIDER_SITE_OTHER): Payer: 59

## 2020-01-28 DIAGNOSIS — O209 Hemorrhage in early pregnancy, unspecified: Secondary | ICD-10-CM

## 2020-01-28 DIAGNOSIS — N926 Irregular menstruation, unspecified: Secondary | ICD-10-CM

## 2020-01-28 LAB — BETA HCG QUANT (REF LAB): hCG Quant: <1 m[IU]/mL

## 2020-03-02 ENCOUNTER — Emergency Department (HOSPITAL_COMMUNITY)
Admission: EM | Admit: 2020-03-02 | Discharge: 2020-03-02 | Disposition: A | Discharge status: Home / Self Care | Payer: 59 | Attending: Emergency Medicine | Admitting: Emergency Medicine

## 2020-03-02 ENCOUNTER — Encounter (HOSPITAL_COMMUNITY): Payer: Self-pay

## 2020-03-02 ENCOUNTER — Other Ambulatory Visit: Payer: Self-pay

## 2020-03-02 ENCOUNTER — Telehealth: Payer: Self-pay

## 2020-03-02 DIAGNOSIS — Z79899 Other long term (current) drug therapy: Secondary | ICD-10-CM | POA: Insufficient documentation

## 2020-03-02 DIAGNOSIS — R300 Dysuria: Secondary | ICD-10-CM | POA: Diagnosis present

## 2020-03-02 DIAGNOSIS — N3000 Acute cystitis without hematuria: Secondary | ICD-10-CM

## 2020-03-02 LAB — URINALYSIS, ROUTINE W REFLEX MICROSCOPIC
Bacteria, UA: NONE SEEN
Bilirubin Urine: NEGATIVE
Glucose, UA: NEGATIVE mg/dL
Hgb urine dipstick: NEGATIVE
Ketones, ur: NEGATIVE mg/dL
Leukocytes,Ua: NEGATIVE
Nitrite: POSITIVE — AB
Protein, ur: NEGATIVE mg/dL
Specific Gravity, Urine: 1.003 — ABNORMAL LOW (ref 1.005–1.030)
pH: 6 (ref 5.0–8.0)

## 2020-03-02 LAB — PREGNANCY, URINE: Preg Test, Ur: NEGATIVE

## 2020-03-02 MED ORDER — CEPHALEXIN 500 MG PO CAPS: 500.0000 mg | ORAL_CAPSULE | Freq: Four times a day (QID) | ORAL | 0 refills | Status: DC

## 2020-03-02 NOTE — ED Triage Notes (Signed)
Being treated for uti with keflex.

## 2020-03-02 NOTE — Discharge Instructions (Addendum)
Continue your keflex. We will contact you if antibiotic needs to be changed based on your culture report. Make sure to drink lots of water to stay hydrated. Make sure to follow-up with your primary care doctor. Return here for any new/acute changes.

## 2020-03-02 NOTE — Telephone Encounter (Signed)
Medication refill request: Junel 1.5/30 Last AEX:  09/06/2018 Next AEX: Not scheduled Last MMG (if hormonal medication request): 08/14/2016 left breat [place] Birads 2 benign Refill authorized:   Dr.Silva, patient was seen 01/21/2020 for bleeding in early pregnancy. Hcgs were followed to 0. Patient is requesting to restart Junel OCP. Would you like to see her for follow up or aex before filling?

## 2020-03-02 NOTE — ED Provider Notes (Signed)
[facility] DEPT Provider Note   CSN: [id number] Arrival date & time: 03/02/20  0018     History Chief Complaint  Patient presents with  . Dysuria    Colleen Diaz is a 29 y.o. female.  The history is provided by the patient and medical records.   29 year old female with history of anemia, anxiety, depression, presenting to the ED with UTI.  States she was diagnosed with UTI last month and prescribed course of Keflex.  States she had resolution of symptoms within 2 days so she stopped taking her antibiotic.  She began having symptoms again this morning so restarted her antibiotics and is taken 4 tablets of the Keflex today.  She states her temporal thermometer at home showed a fever, however she is afebrile here.  States she has been having a little bit of discomfort in her back as well.  Feels urgency, dysuria, and frequency as she was feeling with prior UTI.  She denies any pelvic pain or vaginal discharge.  Past Medical History:  Diagnosis Date  . Allergic rhinitis    followed by Dr Fredderick Phenix  . Anemia   . Anxiety   . Depression   . Fibroadenoma of left breast 2012   Dx by ultrasound.   . IBS (irritable bowel syndrome)   . STD (sexually transmitted disease) 08/2011   Tx'd for Chlamydia    Patient Active Problem List   Diagnosis Date Noted  . Anxiety 06/28/2018    Past Surgical History:  Procedure Laterality Date  . DILATION AND CURETTAGE OF UTERUS  09-04-13   TAB     OB History    Gravida  2   Para      Term      Preterm      AB  2   Living        SAB  1   TAB  1   Ectopic      Multiple      Live Births              Family History  Problem Relation Age of Onset  . Mental illness Mother   . Mental illness Maternal Grandmother   . Cancer Paternal Grandmother        skin cancer  . Thyroid disease Father   . Seizures Maternal Aunt     Social History   Tobacco Use  . Smoking status: Never Smoker  .  Smokeless tobacco: Never Used  Substance Use Topics  . Alcohol use: Yes    Alcohol/week: 1.0 standard drinks    Types: 1 Glasses of wine per week    Comment: social  . Drug use: Yes    Types: Marijuana    Comment: occ    Home Medications Prior to Admission medications   Medication Sig Start Date End Date Taking? Authorizing Provider  ascorbic acid (VITAMIN C) 500 MG tablet Take 500 mg by mouth daily.    [provider]  cholecalciferol (VITAMIN D) 25 MCG (1000 UT) tablet Take 1,000 Units by mouth daily.    [provider]  Multiple Vitamin (MULTIVITAMIN WITH MINERALS) TABS tablet Take 1 tablet by mouth daily.    [provider]  zinc gluconate 50 MG tablet Take 50 mg by mouth daily.    [provider]    Allergies    Doxycycline and Hydrocodone-acetaminophen  Review of Systems   Review of Systems  Genitourinary: Positive for dysuria and frequency.  All other  systems reviewed and are negative.   Physical Exam Updated Vital Signs BP 136/78 (BP Location: Right Arm)   Pulse 83   Temp (!) 97.5 F (36.4 C) (Oral)   Resp 16   Ht 5\' 9"  (1.753 m)   Wt 59 kg   SpO2 100%   BMI 19.20 kg/m   Physical Exam Vitals and nursing note reviewed.  Constitutional:      Appearance: She is well-developed.  HENT:     Head: Normocephalic and atraumatic.  Eyes:     Conjunctiva/sclera: Conjunctivae normal.     Pupils: Pupils are equal, round, and reactive to light.  Cardiovascular:     Rate and Rhythm: Normal rate and regular rhythm.     Heart sounds: Normal heart sounds.  Pulmonary:     Effort: Pulmonary effort is normal.     Breath sounds: Normal breath sounds.  Abdominal:     General: Bowel sounds are normal.     Palpations: Abdomen is soft.     Tenderness: There is no abdominal tenderness.     Comments: Soft, nontender, no focal CVA tenderness  Musculoskeletal:        General: Normal range of motion.     Cervical back: Normal range of  motion.  Skin:    General: Skin is warm and dry.  Neurological:     Mental Status: She is alert and oriented to person, place, and time.     ED Results / Procedures / Treatments   Labs (all labs ordered are listed, but only abnormal results are displayed) Labs Reviewed  URINALYSIS, ROUTINE W REFLEX MICROSCOPIC - Abnormal; Notable for the following components:      Result Value   Color, Urine AMBER (*)    Specific Gravity, Urine 1.003 (*)    Nitrite POSITIVE (*)    All other components within normal limits  URINE CULTURE  PREGNANCY, URINE    EKG None  Radiology No results found.  Procedures Procedures (including critical care time)  Medications Ordered in ED Medications - No data to display  ED Course  I have reviewed the triage vital signs and the nursing notes.  Pertinent labs & imaging results that were available during my care of the patient were reviewed by me and considered in my medical decision making (see chart for details).    MDM Rules/Calculators/A&P  29 year old female here with concern of recurrent UTI.  Diagnosed with UTI last month but only took 2 days of her Keflex, began having symptoms again today.  She is afebrile and nontoxic in appearance here.  Her abdomen is overall soft and benign.  She has no focal CVA tenderness.  Concerned that this may represent partially treated infection.  Will repeat UA with culture.  UA with + nitrites, 6-10 WBCs.  Culture pending.  As she is symptomatic will continue treatment with keflex pending urine culture.  We have discussed making sure to finish complete course of antibiotics even if feeling better.  Encouraged good oral hydration.  Close follow-up with PCP.  Return here for any new/acute changes.  Final Clinical Impression(s) / ED Diagnoses Final diagnoses:  Acute cystitis without hematuria    Rx / DC Orders ED Discharge Orders         Ordered    cephALEXin (KEFLEX) 500 MG capsule  4 times daily      03/02/20 0149           Larene Pickett, PA-C 03/02/20 [place]  Johnsie Cancel, MD 03/02/20 [phone]

## 2020-03-02 NOTE — Telephone Encounter (Signed)
Please schedule an annual exam with me and I can refill her birth control then.

## 2020-03-03 LAB — URINE CULTURE: Culture: NO GROWTH

## 2020-03-03 NOTE — Telephone Encounter (Signed)
Left message to call Kimmie Berggren at [phone]. 

## 2020-03-11 NOTE — Telephone Encounter (Signed)
Spoke with patient. AEX scheduled for 03/15/2020 at 1 pm with Dr.Silva. Patient does not want to restart OCP at this time. Encounter closed.

## 2020-03-12 ENCOUNTER — Other Ambulatory Visit: Payer: Self-pay

## 2020-03-15 ENCOUNTER — Ambulatory Visit: Payer: 59 | Admitting: Obstetrics and Gynecology

## 2020-03-24 ENCOUNTER — Other Ambulatory Visit: Payer: Self-pay

## 2020-03-24 NOTE — Progress Notes (Deleted)
29 y.o. G58P0020 Single Caucasian female here for annual exam.    PCP:     No LMP recorded.           Sexually active: {yes no:314532}  The current method of family planning is {contraception:315051}.    Exercising: {yes no:314532}  {types:19826} Smoker:  no  Health Maintenance: Pap:  08/02/2016 Neg History of abnormal Pap:  no MMG:  n/a Colonoscopy:  n/a BMD:   n/a  Result  n/a TDaP:  *** Gardasil:   Yes, completed HIV: 03-18-14 NR Hep C: 03-18-14 Neg Screening Labs:  Hb today: ***, Urine today: ***   reports that she has never smoked. She has never used smokeless tobacco. She reports current alcohol use of about 1.0 standard drinks of alcohol per week. She reports current drug use. Drug: Marijuana.  Past Medical History:  Diagnosis Date  . Allergic rhinitis    followed by Dr Fredderick Phenix  . Anemia   . Anxiety   . Depression   . Fibroadenoma of left breast 2012   Dx by ultrasound.   . IBS (irritable bowel syndrome)   . STD (sexually transmitted disease) 08/2011   Tx'd for Chlamydia    Past Surgical History:  Procedure Laterality Date  . DILATION AND CURETTAGE OF UTERUS  09-04-13   TAB    Current Outpatient Medications  Medication Sig Dispense Refill  . ascorbic acid (VITAMIN C) 500 MG tablet Take 500 mg by mouth daily.    . cephALEXin (KEFLEX) 500 MG capsule Take 1 capsule (500 mg total) by mouth 4 (four) times daily. 20 capsule 0  . cholecalciferol (VITAMIN D) 25 MCG (1000 UT) tablet Take 1,000 Units by mouth daily.    . Multiple Vitamin (MULTIVITAMIN WITH MINERALS) TABS tablet Take 1 tablet by mouth daily.    Marland Kitchen zinc gluconate 50 MG tablet Take 50 mg by mouth daily.     No current facility-administered medications for this visit.    Family History  Problem Relation Age of Onset  . Mental illness Mother   . Mental illness Maternal Grandmother   . Cancer Paternal Grandmother        skin cancer  . Thyroid disease Father   . Seizures Maternal Aunt     Review of  Systems  Exam:   There were no vitals taken for this visit.    General appearance: alert, cooperative and appears stated age Head: normocephalic, without obvious abnormality, atraumatic Neck: no adenopathy, supple, symmetrical, trachea midline and thyroid normal to inspection and palpation Lungs: clear to auscultation bilaterally Breasts: normal appearance, no masses or tenderness, No nipple retraction or dimpling, No nipple discharge or bleeding, No axillary adenopathy Heart: regular rate and rhythm Abdomen: soft, non-tender; no masses, no organomegaly Extremities: extremities normal, atraumatic, no cyanosis or edema Skin: skin color, texture, turgor normal. No rashes or lesions Lymph nodes: cervical, supraclavicular, and axillary nodes normal. Neurologic: grossly normal  Pelvic: External genitalia:  no lesions              No abnormal inguinal nodes palpated.              Urethra:  normal appearing urethra with no masses, tenderness or lesions              Bartholins and Skenes: normal                 Vagina: normal appearing vagina with normal color and discharge, no lesions  Cervix: no lesions              Pap taken: {yes no:314532} Bimanual Exam:  Uterus:  normal size, contour, position, consistency, mobility, non-tender              Adnexa: no mass, fullness, tenderness              Rectal exam: {yes no:314532}.  Confirms.              Anus:  normal sphincter tone, no lesions  Chaperone was present for exam.  Assessment:   Well woman visit with normal exam.   Plan: Mammogram screening discussed. Self breast awareness reviewed. Pap and HR HPV as above. Guidelines for Calcium, Vitamin D, regular exercise program including cardiovascular and weight bearing exercise.   Follow up annually and prn.   Additional counseling given.  {yes [id number]. _______ minutes face to face time of which over 50% was spent in counseling.    After visit summary provided.

## 2020-03-25 ENCOUNTER — Ambulatory Visit: Payer: 59 | Admitting: Obstetrics and Gynecology

## 2020-03-28 ENCOUNTER — Other Ambulatory Visit: Payer: Self-pay

## 2020-03-28 ENCOUNTER — Emergency Department (HOSPITAL_COMMUNITY)
Admission: EM | Admit: 2020-03-28 | Discharge: 2020-03-28 | Disposition: A | Discharge status: Home / Self Care | Payer: 59 | Attending: Emergency Medicine | Admitting: Emergency Medicine

## 2020-03-28 ENCOUNTER — Encounter (HOSPITAL_COMMUNITY): Payer: Self-pay

## 2020-03-28 DIAGNOSIS — R110 Nausea: Secondary | ICD-10-CM | POA: Diagnosis not present

## 2020-03-28 DIAGNOSIS — R350 Frequency of micturition: Secondary | ICD-10-CM | POA: Insufficient documentation

## 2020-03-28 DIAGNOSIS — N644 Mastodynia: Secondary | ICD-10-CM | POA: Insufficient documentation

## 2020-03-28 DIAGNOSIS — Z3202 Encounter for pregnancy test, result negative: Secondary | ICD-10-CM | POA: Diagnosis not present

## 2020-03-28 DIAGNOSIS — R1084 Generalized abdominal pain: Secondary | ICD-10-CM | POA: Diagnosis present

## 2020-03-28 DIAGNOSIS — R1012 Left upper quadrant pain: Secondary | ICD-10-CM | POA: Insufficient documentation

## 2020-03-28 LAB — COMPREHENSIVE METABOLIC PANEL
ALT: 15 U/L (ref 0–44)
AST: 18 U/L (ref 15–41)
Albumin: 4.1 g/dL (ref 3.5–5.0)
Alkaline Phosphatase: 61 U/L (ref 38–126)
Anion gap: 8 (ref 5–15)
BUN: 9 mg/dL (ref 6–20)
CO2: 27 mmol/L (ref 22–32)
Calcium: 9.1 mg/dL (ref 8.9–10.3)
Chloride: 103 mmol/L (ref 98–111)
Creatinine, Ser: 0.73 mg/dL (ref 0.44–1.00)
GFR calc Af Amer: >60 mL/min (ref >60)
GFR calc non Af Amer: >60 mL/min (ref >60)
Glucose, Bld: 107 mg/dL — ABNORMAL HIGH (ref 70–99)
Potassium: 4.1 mmol/L (ref 3.5–5.1)
Sodium: 138 mmol/L (ref 135–145)
Total Bilirubin: 1.1 mg/dL (ref 0.3–1.2)
Total Protein: 7.1 g/dL (ref 6.5–8.1)

## 2020-03-28 LAB — URINALYSIS, ROUTINE W REFLEX MICROSCOPIC
Bacteria, UA: NONE SEEN
Bilirubin Urine: NEGATIVE
Glucose, UA: NEGATIVE mg/dL
Ketones, ur: 5 mg/dL — AB
Leukocytes,Ua: NEGATIVE
Nitrite: NEGATIVE
Protein, ur: NEGATIVE mg/dL
Specific Gravity, Urine: 1.015 (ref 1.005–1.030)
pH: 8 (ref 5.0–8.0)

## 2020-03-28 LAB — WET PREP, GENITAL
Sperm: NONE SEEN
Trich, Wet Prep: NONE SEEN
Yeast Wet Prep HPF POC: NONE SEEN

## 2020-03-28 LAB — CBC
HCT: 37.8 % (ref 36.0–46.0)
Hemoglobin: 12.2 g/dL (ref 12.0–15.0)
MCH: 28.6 pg (ref 26.0–34.0)
MCHC: 32.3 g/dL (ref 30.0–36.0)
MCV: 88.7 fL (ref 80.0–100.0)
Platelets: 321 10*3/uL (ref 150–400)
RBC: 4.26 MIL/uL (ref 3.87–5.11)
RDW: 13.1 % (ref 11.5–15.5)
WBC: 11.6 10*3/uL — ABNORMAL HIGH (ref 4.0–10.5)
nRBC: 0 % (ref 0.0–0.2)

## 2020-03-28 LAB — I-STAT BETA HCG BLOOD, ED (MC, WL, AP ONLY): I-stat hCG, quantitative: <5 m[IU]/mL (ref <5)

## 2020-03-28 LAB — LIPASE, BLOOD: Lipase: 18 U/L (ref 11–51)

## 2020-03-28 MED ORDER — SODIUM CHLORIDE 0.9% FLUSH: 3.0000 mL | Freq: Once | INTRAVENOUS | Status: DC

## 2020-03-28 NOTE — ED Triage Notes (Signed)
Pt presents with c/o right lower abdominal pain that started as generalized abdominal pain last night. Pt is also reporting some nausea.

## 2020-03-28 NOTE — Discharge Instructions (Signed)
Take Ibuprofen or Tylenol for mild pain Please return if you develop any worsening pain in the right lower abdomen, fever, vomiting

## 2020-03-28 NOTE — ED Provider Notes (Signed)
[facility] DEPT Provider Note   CSN: [id number] Arrival date & time: 03/28/20  1258     History Chief Complaint  Patient presents with  . Abdominal Pain    Colleen Diaz is a 29 y.o. G15P0030 female with history of IBS who presents with abdominal pain.  She states that yesterday she started to have some nausea.  Overnight she started to have some generalized abdominal pain.  It seemed to localize over the right side of the abdomen this morning.  Pain was severe this morning which she describes as 8 out of 10.  She feels like her stomach is "inflamed".  She has never had pain like this with IBS before.  Pain is currently about a 3 out of 10 and is on the right side of her abdomen and all across her lower abdomen.  Her last bowel movement was yesterday and was normal.  She denies fever, chills, vomiting, urinary symptoms, vaginal discharge.  She is currently on her period for the past 4 days.  Her periods are generally pretty normal.  She is concerned she may be pregnant.  She has had some breast tenderness, nausea, and urinary frequency.  She was here last month and treated for UTI and states that symptoms are resolved from that.  She denies any prior abdominal surgeries.  HPI     Past Medical History:  Diagnosis Date  . Allergic rhinitis    followed by Dr Fredderick Phenix  . Anemia   . Anxiety   . Depression   . Fibroadenoma of left breast 2012   Dx by ultrasound.   . IBS (irritable bowel syndrome)   . STD (sexually transmitted disease) 08/2011   Tx'd for Chlamydia    Patient Active Problem List   Diagnosis Date Noted  . Anxiety 06/28/2018    Past Surgical History:  Procedure Laterality Date  . DILATION AND CURETTAGE OF UTERUS  09-04-13   TAB     OB History    Gravida  2   Para      Term      Preterm      AB  2   Living        SAB  1   TAB  1   Ectopic      Multiple      Live Births              Family History    Problem Relation Age of Onset  . Mental illness Mother   . Mental illness Maternal Grandmother   . Cancer Paternal Grandmother        skin cancer  . Thyroid disease Father   . Seizures Maternal Aunt     Social History   Tobacco Use  . Smoking status: Never Smoker  . Smokeless tobacco: Never Used  Substance Use Topics  . Alcohol use: Yes    Alcohol/week: 1.0 standard drinks    Types: 1 Glasses of wine per week    Comment: social  . Drug use: Yes    Types: Marijuana    Comment: occ    Home Medications Prior to Admission medications   Medication Sig Start Date End Date Taking? Authorizing Provider  cephALEXin (KEFLEX) 500 MG capsule Take 1 capsule (500 mg total) by mouth 4 (four) times daily. Patient not taking: Reported on 03/28/2020 03/02/20   Larene Pickett, PA-C    Allergies    Doxycycline and Hydrocodone-acetaminophen  Review of Systems  Review of Systems  Constitutional: Negative for chills and fever.  Respiratory: Negative for shortness of breath.   Cardiovascular: Negative for chest pain.  Gastrointestinal: Positive for abdominal pain and nausea. Negative for constipation, diarrhea and vomiting.  Genitourinary: Positive for vaginal bleeding. Negative for dysuria, flank pain, menstrual problem and vaginal discharge.  All other systems reviewed and are negative.   Physical Exam Updated Vital Signs BP 120/81 (BP Location: Right Arm)   Pulse 60   Temp 98.8 F (37.1 C) (Oral)   Resp 14   LMP 03/28/2020 (Approximate)   SpO2 99%   Physical Exam Vitals and nursing note reviewed.  Constitutional:      General: She is not in acute distress.    Appearance: Normal appearance. She is well-developed. She is not ill-appearing.  HENT:     Head: Normocephalic and atraumatic.  Eyes:     General: No scleral icterus.       Right eye: No discharge.        Left eye: No discharge.     Conjunctiva/sclera: Conjunctivae normal.     Pupils: Pupils are equal, round, and  reactive to light.  Cardiovascular:     Rate and Rhythm: Normal rate and regular rhythm.  Pulmonary:     Effort: Pulmonary effort is normal. No respiratory distress.     Breath sounds: Normal breath sounds.  Abdominal:     General: There is no distension.     Palpations: Abdomen is soft.     Tenderness: There is no abdominal tenderness.  Genitourinary:    Comments: Pelvic: No inguinal lymphadenopathy or inguinal hernia noted. Normal external genitalia. No pain with speculum insertion. Closed cervical os with normal appearance - no rash or lesions. Small amount of blood in vaginal vault. On bimanual examination no adnexal tenderness or cervical motion tenderness. Chaperone present during exam.   Musculoskeletal:     Cervical back: Normal range of motion.  Skin:    General: Skin is warm and dry.  Neurological:     Mental Status: She is alert and oriented to person, place, and time.  Psychiatric:        Behavior: Behavior normal.     ED Results / Procedures / Treatments   Labs (all labs ordered are listed, but only abnormal results are displayed) Labs Reviewed  WET PREP, GENITAL - Abnormal; Notable for the following components:      Result Value   Clue Cells Wet Prep HPF POC PRESENT (*)    WBC, Wet Prep HPF POC FEW (*)    All other components within normal limits  COMPREHENSIVE METABOLIC PANEL - Abnormal; Notable for the following components:   Glucose, Bld 107 (*)    All other components within normal limits  CBC - Abnormal; Notable for the following components:   WBC 11.6 (*)    All other components within normal limits  URINALYSIS, ROUTINE W REFLEX MICROSCOPIC - Abnormal; Notable for the following components:   APPearance HAZY (*)    Hgb urine dipstick SMALL (*)    Ketones, ur 5 (*)    All other components within normal limits  LIPASE, BLOOD  I-STAT BETA HCG BLOOD, ED ([facility], [facility], [facility] ONLY)  GC/CHLAMYDIA PROBE AMP ([place]) NOT AT [facility]    EKG None  Radiology No  results found.  Procedures Procedures (including critical care time)  Medications Ordered in ED Medications  sodium chloride flush (NS) 0.9 % injection 3 mL (has no administration in time range)  ED Course  I have reviewed the triage vital signs and the nursing notes.  Pertinent labs & imaging results that were available during my care of the patient were reviewed by me and considered in my medical decision making (see chart for details).  29 year old female with history of IBS presents with abdominal pain.  Her vital signs are normal.  Heart is regular rate and rhythm.  Lungs are clear to auscultation.  Abdomen is soft and she is tender on the right side of the abdomen and also across the lower abdomen.  Pelvic exam was performed and shows vaginal bleeding.  She has no adnexal or or cervical motion tenderness.  White blood cell count was minimally elevated (11).  CMP and lipase are normal.  UA is normal.  Wet prep is normal.  Gonorrhea and Chlamydia tests were sent off but I have low suspicion for PID.   On repeat abdominal exam she also has left upper quadrant tenderness.  I have low suspicion for acute surgical emergency such as appendicitis. We discussed possibly obtaining imaging versus going home with strict return precautions since she is comfortable right now.  She would like to defer imaging at this time and she understands to come back if worse.  MDM Rules/Calculators/A&P                       Final Clinical Impression(s) / ED Diagnoses Final diagnoses:  Generalized abdominal pain    Rx / DC Orders ED Discharge Orders    None       Recardo Evangelist, PA-C 03/28/20 1740    Carmin Muskrat, MD 03/28/20 [phone]

## 2020-03-29 LAB — GC/CHLAMYDIA PROBE AMP (~~LOC~~) NOT AT ARMC
Chlamydia: NEGATIVE
Comment: NEGATIVE
Comment: NORMAL
Neisseria Gonorrhea: NEGATIVE

## 2020-12-18 NOTE — L&D Delivery Note (Signed)
Delivery Note  SVD viable female Apgars 9,9 over intact perineum.  Placenta delivered spontaneously intact with 3VC.Good support and hemostasis noted.  R/V exam confirms..   Mother and baby to couplet care and are doing well.  EBL 100cc  Louretta Shorten, MD

## 2021-05-10 LAB — OB RESULTS CONSOLE HEPATITIS B SURFACE ANTIGEN: Hepatitis B Surface Ag: NEGATIVE

## 2021-05-10 LAB — OB RESULTS CONSOLE ANTIBODY SCREEN: Antibody Screen: NEGATIVE

## 2021-05-10 LAB — OB RESULTS CONSOLE GC/CHLAMYDIA
Chlamydia: NEGATIVE
Gonorrhea: NEGATIVE

## 2021-05-10 LAB — OB RESULTS CONSOLE ABO/RH: RH Type: POSITIVE

## 2021-05-10 LAB — OB RESULTS CONSOLE RPR: RPR: NONREACTIVE

## 2021-05-10 LAB — OB RESULTS CONSOLE VARICELLA ZOSTER ANTIBODY, IGG: Varicella: NON-IMMUNE/NOT IMMUNE

## 2021-05-10 LAB — OB RESULTS CONSOLE RUBELLA ANTIBODY, IGM: Rubella: IMMUNE

## 2021-05-10 LAB — OB RESULTS CONSOLE HIV ANTIBODY (ROUTINE TESTING): HIV: NONREACTIVE

## 2021-05-11 ENCOUNTER — Other Ambulatory Visit: Payer: Self-pay | Admitting: Obstetrics and Gynecology

## 2021-05-11 DIAGNOSIS — O365931 Maternal care for other known or suspected poor fetal growth, third trimester, fetus 1: Secondary | ICD-10-CM

## 2021-05-12 ENCOUNTER — Ambulatory Visit: Payer: 59 | Admitting: *Deleted

## 2021-05-12 ENCOUNTER — Other Ambulatory Visit: Payer: Self-pay

## 2021-05-12 ENCOUNTER — Encounter: Payer: Self-pay | Admitting: *Deleted

## 2021-05-12 ENCOUNTER — Ambulatory Visit (HOSPITAL_BASED_OUTPATIENT_CLINIC_OR_DEPARTMENT_OTHER): Payer: 59 | Admitting: Obstetrics and Gynecology

## 2021-05-12 ENCOUNTER — Ambulatory Visit: Payer: 59 | Attending: Obstetrics and Gynecology

## 2021-05-12 VITALS — BP 123/67 | HR 90

## 2021-05-12 DIAGNOSIS — O365930 Maternal care for other known or suspected poor fetal growth, third trimester, not applicable or unspecified: Secondary | ICD-10-CM

## 2021-05-12 DIAGNOSIS — O365931 Maternal care for other known or suspected poor fetal growth, third trimester, fetus 1: Secondary | ICD-10-CM | POA: Diagnosis present

## 2021-05-12 DIAGNOSIS — Z3A36 36 weeks gestation of pregnancy: Secondary | ICD-10-CM | POA: Insufficient documentation

## 2021-05-12 NOTE — Progress Notes (Signed)
Maternal-Fetal Medicine  Name: Colleen Diaz DOB: 1991/08/03 MRN: [id number] Referring Provider: Louretta Shorten, MD  I had the pleasure of seeing Colleen Diaz today at the [facility]. She is G5 P0 at 36w 2d gestation and is here for ultrasound and consultation. Fetal growth restriction was diagnosed on ultrasound performed in [place], [place].  Patient recently moved from [facility] and will be delivering in [place]. Her prenatal course was uneventful.  On cell free fetal DNA screening, the risks of fetal aneuploidies are not increased.  MSAFP screening showed low risk for open neural tube defects.  She does not have gestational diabetes.  Her blood pressures have been normal at prenatal visits.  On ultrasound performed on 04/28/2021, the estimated fetal weight was 4 pounds 14 ounces and at the 25th percentile.  Abdominal circumference measurement was at the 7th percentile.  3 weeks before that ultrasound the abdominal circumference measurement was at the 5th percentile and the estimated fetal weight was at the 12th percentile. Midtrimester fetal anatomical survey was reportedly normal.  Past medical history: No history of diabetes or hypertension or thyroid disorders. Past surgical history: Nil of note. Medications: Prenatal vitamins, iron supplements. Allergies: Oxycodone and doxycycline. Social history: Denies tobacco or drug or alcohol use.  She is single and her fianc is in the [employer].  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 Family history: Both parents are in good health.  No history of venous thromboembolism in the family. GYN history: Abnormal Pap smears in October 2021 and colposcopy was performed.  No history of LEEP.  No history of breast disease.  Ultrasound On today's ultrasound, fetal growth is appropriate for gestational age.  The estimated fetal weight is at the 28th percentile (Hadlock formula).  Abdominal circumference measurement is at the 34th  percentile.  Amniotic fluid is normal and good fetal activity seen. Antenatal testing is reassuring.  BPP 8/8.  Umbilical artery Doppler showed normal forward diastolic flow. Fetal anatomical survey appears normal but limited by advanced gestational age.  Cephalic presentation.  Suspected fetal growth restriction I reassured the patient of the findings that are not consistent with fetal growth restriction.  I explained possible discrepancy in estimating fetal weights at different institutions using different formula. Antenatal testing is reassuring. I reassured her that the likelihood of having a normal outcome in this pregnancy is very high. Patient does not have any risk factors for fetal growth restriction.  We briefly discussed timing of delivery.  It is reasonable to consider induction of labor at [redacted] weeks gestation provided cervix is favorable.  Recommendations -Weekly BPP or NST at your office. -Consider delivery at [redacted] weeks gestation.  Thank you for consultation.  Consultation including face-to-face counseling 30 minutes. If you have any questions, please contact me at the [facility].

## 2021-05-16 LAB — OB RESULTS CONSOLE GBS: GBS: NEGATIVE

## 2021-05-25 ENCOUNTER — Encounter (HOSPITAL_COMMUNITY): Payer: Self-pay | Admitting: *Deleted

## 2021-05-25 ENCOUNTER — Telehealth (HOSPITAL_COMMUNITY): Payer: Self-pay | Admitting: *Deleted

## 2021-05-25 NOTE — Telephone Encounter (Signed)
Preadmission screen  

## 2021-05-31 ENCOUNTER — Other Ambulatory Visit (HOSPITAL_COMMUNITY)
Admission: RE | Admit: 2021-05-31 | Discharge: 2021-05-31 | Disposition: A | Discharge status: Home / Self Care | Payer: 59 | Source: Ambulatory Visit | Attending: Obstetrics and Gynecology | Admitting: Obstetrics and Gynecology

## 2021-05-31 DIAGNOSIS — Z20822 Contact with and (suspected) exposure to covid-19: Secondary | ICD-10-CM | POA: Insufficient documentation

## 2021-05-31 DIAGNOSIS — Z01812 Encounter for preprocedural laboratory examination: Secondary | ICD-10-CM | POA: Insufficient documentation

## 2021-05-31 LAB — SARS CORONAVIRUS 2 (TAT 6-24 HRS): SARS Coronavirus 2: NEGATIVE

## 2021-06-01 ENCOUNTER — Other Ambulatory Visit: Payer: Self-pay

## 2021-06-02 ENCOUNTER — Inpatient Hospital Stay (HOSPITAL_COMMUNITY)
Admission: AD | Admit: 2021-06-02 | Discharge: 2021-06-04 | DRG: 807 | Disposition: A | Discharge status: Home / Self Care | Payer: 59 | Attending: Obstetrics and Gynecology | Admitting: Obstetrics and Gynecology

## 2021-06-02 ENCOUNTER — Inpatient Hospital Stay (HOSPITAL_COMMUNITY): Payer: 59 | Admitting: Anesthesiology

## 2021-06-02 ENCOUNTER — Encounter (HOSPITAL_COMMUNITY): Payer: Self-pay | Admitting: Obstetrics and Gynecology

## 2021-06-02 ENCOUNTER — Other Ambulatory Visit: Payer: Self-pay

## 2021-06-02 ENCOUNTER — Inpatient Hospital Stay (HOSPITAL_COMMUNITY): Payer: 59

## 2021-06-02 DIAGNOSIS — O26893 Other specified pregnancy related conditions, third trimester: Principal | ICD-10-CM | POA: Diagnosis present

## 2021-06-02 DIAGNOSIS — Z20822 Contact with and (suspected) exposure to covid-19: Secondary | ICD-10-CM | POA: Diagnosis present

## 2021-06-02 DIAGNOSIS — Z3A39 39 weeks gestation of pregnancy: Secondary | ICD-10-CM | POA: Diagnosis not present

## 2021-06-02 DIAGNOSIS — Z3490 Encounter for supervision of normal pregnancy, unspecified, unspecified trimester: Secondary | ICD-10-CM | POA: Diagnosis present

## 2021-06-02 LAB — TYPE AND SCREEN
ABO/RH(D): O POS
Antibody Screen: NEGATIVE

## 2021-06-02 LAB — CBC
HCT: 32.7 % — ABNORMAL LOW (ref 36.0–46.0)
Hemoglobin: 11 g/dL — ABNORMAL LOW (ref 12.0–15.0)
MCH: 28.4 pg (ref 26.0–34.0)
MCHC: 33.6 g/dL (ref 30.0–36.0)
MCV: 84.3 fL (ref 80.0–100.0)
Platelets: 295 10*3/uL (ref 150–400)
RBC: 3.88 MIL/uL (ref 3.87–5.11)
RDW: 13.3 % (ref 11.5–15.5)
WBC: 10.9 10*3/uL — ABNORMAL HIGH (ref 4.0–10.5)
nRBC: 0 % (ref 0.0–0.2)

## 2021-06-02 LAB — RPR: RPR Ser Ql: NONREACTIVE

## 2021-06-02 MED ORDER — SIMETHICONE 80 MG PO CHEW: 80.0000 mg | CHEWABLE_TABLET | ORAL | Status: DC | PRN

## 2021-06-02 MED ORDER — OXYTOCIN BOLUS FROM INFUSION
333.0000 mL | Freq: Once | INTRAVENOUS | Status: AC
  Administered 2021-06-02: 333 mL via INTRAVENOUS

## 2021-06-02 MED ORDER — OXYCODONE-ACETAMINOPHEN 5-325 MG PO TABS: 1.0000 | ORAL_TABLET | ORAL | Status: DC | PRN

## 2021-06-02 MED ORDER — TERBUTALINE SULFATE 1 MG/ML IJ SOLN: 0.2500 mg | Freq: Once | INTRAMUSCULAR | Status: DC | PRN

## 2021-06-02 MED ORDER — MEASLES, MUMPS & RUBELLA VAC IJ SOLR: 0.5000 mL | Freq: Once | INTRAMUSCULAR | Status: DC

## 2021-06-02 MED ORDER — DIPHENHYDRAMINE HCL 50 MG/ML IJ SOLN: 12.5000 mg | INTRAMUSCULAR | Status: DC | PRN

## 2021-06-02 MED ORDER — SENNOSIDES-DOCUSATE SODIUM 8.6-50 MG PO TABS
2.0000 | ORAL_TABLET | Freq: Every day | ORAL | Status: DC
  Administered 2021-06-03: 2 via ORAL
  Filled 2021-06-02 (×2): qty 2

## 2021-06-02 MED ORDER — ACETAMINOPHEN 325 MG PO TABS
650.0000 mg | ORAL_TABLET | ORAL | Status: DC | PRN
  Filled 2021-06-02: qty 2

## 2021-06-02 MED ORDER — LIDOCAINE HCL (PF) 1 % IJ SOLN: 30.0000 mL | INTRAMUSCULAR | Status: DC | PRN

## 2021-06-02 MED ORDER — LACTATED RINGERS IV SOLN: 500.0000 mL | Freq: Once | INTRAVENOUS | Status: DC

## 2021-06-02 MED ORDER — MEDROXYPROGESTERONE ACETATE 150 MG/ML IM SUSP: 150.0000 mg | INTRAMUSCULAR | Status: DC | PRN

## 2021-06-02 MED ORDER — ONDANSETRON HCL 4 MG/2ML IJ SOLN
4.0000 mg | Freq: Four times a day (QID) | INTRAMUSCULAR | Status: DC | PRN
  Administered 2021-06-02: 4 mg via INTRAVENOUS
  Filled 2021-06-02: qty 2

## 2021-06-02 MED ORDER — PHENYLEPHRINE 40 MCG/ML (10ML) SYRINGE FOR IV PUSH (FOR BLOOD PRESSURE SUPPORT): 80.0000 ug | PREFILLED_SYRINGE | INTRAVENOUS | Status: DC | PRN

## 2021-06-02 MED ORDER — MISOPROSTOL 50MCG HALF TABLET
50.0000 ug | ORAL_TABLET | ORAL | Status: AC
  Administered 2021-06-02: 50 ug via ORAL
  Filled 2021-06-02: qty 1

## 2021-06-02 MED ORDER — BUTORPHANOL TARTRATE 1 MG/ML IJ SOLN
1.0000 mg | INTRAMUSCULAR | Status: DC | PRN
Start: 2021-06-02 — End: 2021-06-02
  Administered 2021-06-02: 1 mg via INTRAVENOUS
  Filled 2021-06-02 (×2): qty 1

## 2021-06-02 MED ORDER — ONDANSETRON HCL 4 MG PO TABS: 4.0000 mg | ORAL_TABLET | ORAL | Status: DC | PRN

## 2021-06-02 MED ORDER — ONDANSETRON HCL 4 MG/2ML IJ SOLN: 4.0000 mg | INTRAMUSCULAR | Status: DC | PRN

## 2021-06-02 MED ORDER — OXYTOCIN-SODIUM CHLORIDE 30-0.9 UT/500ML-% IV SOLN
2.5000 [IU]/h | INTRAVENOUS | Status: DC
  Filled 2021-06-02: qty 500

## 2021-06-02 MED ORDER — LIDOCAINE HCL (PF) 1 % IJ SOLN
INTRAMUSCULAR | Status: DC | PRN
  Administered 2021-06-02: 11 mL via EPIDURAL

## 2021-06-02 MED ORDER — ZOLPIDEM TARTRATE 5 MG PO TABS: 5.0000 mg | ORAL_TABLET | Freq: Every evening | ORAL | Status: DC | PRN

## 2021-06-02 MED ORDER — BENZOCAINE-MENTHOL 20-0.5 % EX AERO
1.0000 "application " | INHALATION_SPRAY | CUTANEOUS | Status: DC | PRN
  Administered 2021-06-02: 1 via TOPICAL
  Filled 2021-06-02: qty 56

## 2021-06-02 MED ORDER — PRENATAL MULTIVITAMIN CH
1.0000 | ORAL_TABLET | Freq: Every day | ORAL | Status: DC
  Administered 2021-06-03: 1 via ORAL
  Filled 2021-06-02 (×2): qty 1

## 2021-06-02 MED ORDER — EPHEDRINE 5 MG/ML INJ: 10.0000 mg | INTRAVENOUS | Status: DC | PRN

## 2021-06-02 MED ORDER — DIBUCAINE (PERIANAL) 1 % EX OINT: 1.0000 "application " | TOPICAL_OINTMENT | CUTANEOUS | Status: DC | PRN

## 2021-06-02 MED ORDER — OXYCODONE-ACETAMINOPHEN 5-325 MG PO TABS
1.0000 | ORAL_TABLET | ORAL | Status: DC | PRN
  Administered 2021-06-03: 1 via ORAL
  Filled 2021-06-02: qty 1

## 2021-06-02 MED ORDER — TETANUS-DIPHTH-ACELL PERTUSSIS 5-2.5-18.5 LF-MCG/0.5 IM SUSY: 0.5000 mL | PREFILLED_SYRINGE | Freq: Once | INTRAMUSCULAR | Status: DC

## 2021-06-02 MED ORDER — FENTANYL-BUPIVACAINE-NACL 0.5-0.125-0.9 MG/250ML-% EP SOLN
12.0000 mL/h | EPIDURAL | Status: DC | PRN
  Administered 2021-06-02: 12 mL/h via EPIDURAL
  Filled 2021-06-02: qty 250

## 2021-06-02 MED ORDER — ACETAMINOPHEN 325 MG PO TABS: 650.0000 mg | ORAL_TABLET | ORAL | Status: DC | PRN

## 2021-06-02 MED ORDER — OXYTOCIN-SODIUM CHLORIDE 30-0.9 UT/500ML-% IV SOLN
1.0000 m[IU]/min | INTRAVENOUS | Status: DC
  Administered 2021-06-02: 2 m[IU]/min via INTRAVENOUS

## 2021-06-02 MED ORDER — LACTATED RINGERS IV SOLN
500.0000 mL | INTRAVENOUS | Status: DC | PRN
Start: 2021-06-02 — End: 2021-06-02
  Administered 2021-06-02: 500 mL via INTRAVENOUS

## 2021-06-02 MED ORDER — LACTATED RINGERS IV SOLN: INTRAVENOUS | Status: DC

## 2021-06-02 MED ORDER — OXYCODONE-ACETAMINOPHEN 5-325 MG PO TABS: 2.0000 | ORAL_TABLET | ORAL | Status: DC | PRN

## 2021-06-02 MED ORDER — DIPHENHYDRAMINE HCL 25 MG PO CAPS: 25.0000 mg | ORAL_CAPSULE | Freq: Four times a day (QID) | ORAL | Status: DC | PRN

## 2021-06-02 MED ORDER — IBUPROFEN 600 MG PO TABS
600.0000 mg | ORAL_TABLET | Freq: Four times a day (QID) | ORAL | Status: DC
  Administered 2021-06-02 – 2021-06-04 (×6): 600 mg via ORAL
  Filled 2021-06-02 (×7): qty 1

## 2021-06-02 MED ORDER — SOD CITRATE-CITRIC ACID 500-334 MG/5ML PO SOLN: 30.0000 mL | ORAL | Status: DC | PRN

## 2021-06-02 MED ORDER — COCONUT OIL OIL
1.0000 "application " | TOPICAL_OIL | Status: DC | PRN
  Administered 2021-06-03: 1 via TOPICAL

## 2021-06-02 MED ORDER — WITCH HAZEL-GLYCERIN EX PADS: 1.0000 "application " | MEDICATED_PAD | CUTANEOUS | Status: DC | PRN

## 2021-06-02 NOTE — Anesthesia Procedure Notes (Signed)
Epidural Patient location during procedure: OB Start time: 06/02/2021 9:30 AM End time: 06/02/2021 9:52 AM  Staffing Anesthesiologist: Lynda Rainwater, MD Performed: anesthesiologist   Preanesthetic Checklist Completed: patient identified, IV checked, site marked, risks and benefits discussed, surgical consent, monitors and equipment checked, pre-op evaluation and timeout performed  Epidural Patient position: sitting Prep: ChloraPrep Patient monitoring: heart rate, cardiac monitor, continuous pulse ox and blood pressure Approach: midline Location: L2-L3 Injection technique: LOR saline  Needle:  Needle type: Tuohy  Needle gauge: 17 G Needle length: 9 cm Needle insertion depth: 6 cm Catheter type: closed end flexible Catheter size: 20 Guage Catheter at skin depth: 10 cm Test dose: negative  Assessment Events: blood not aspirated, injection not painful, no injection resistance, no paresthesia and negative IV test  Additional Notes Reason for block:procedure for pain

## 2021-06-02 NOTE — Anesthesia Preprocedure Evaluation (Addendum)
Anesthesia Evaluation    Airway Mallampati: II  TM Distance: >3 FB Neck ROM: Full    Dental no notable dental hx.    Pulmonary neg pulmonary ROS,    Pulmonary exam normal breath sounds clear to auscultation       Cardiovascular negative cardio ROS Normal cardiovascular exam Rhythm:Regular Rate:Normal     Neuro/Psych PSYCHIATRIC DISORDERS Anxiety Depression negative neurological ROS     GI/Hepatic negative GI ROS, Neg liver ROS,   Endo/Other  negative endocrine ROS  Renal/GU negative Renal ROS  negative genitourinary   Musculoskeletal negative musculoskeletal ROS (+)   Abdominal   Peds  Hematology  (+) anemia ,   Anesthesia Other Findings   Reproductive/Obstetrics (+) Pregnancy                            Anesthesia Physical Anesthesia Plan  ASA: 2  Anesthesia Plan: Epidural   Post-op Pain Management:    Induction:   PONV Risk Score and Plan:   Airway Management Planned: Natural Airway  Additional Equipment:   Intra-op Plan:   Post-operative Plan:   Informed Consent: I have reviewed the patients History and Physical, chart, labs and discussed the procedure including the risks, benefits and alternatives for the proposed anesthesia with the patient or authorized representative who has indicated his/her understanding and acceptance.       Plan Discussed with: Anesthesiologist  Anesthesia Plan Comments:         Anesthesia Quick Evaluation

## 2021-06-02 NOTE — Lactation Note (Signed)
This note was copied from a baby's chart. Lactation Consultation Note  Patient Name: Colleen Diaz ITGPQ'D Date: 06/02/2021 Reason for consult: Mother's request;L&D Initial assessment;Difficult latch;Primapara;1st time breastfeeding;Term Age:29 hours Infant has some amniotic fluid in her mouth making it hard for her to sustain the latch at the breast. LC did use bulb suction to remove some amniotic fluid.   LC attempted to latch in football and cross cradle. Nipple flat, tea cup hold required to get a latch but no suck initiated by infant.  Mom to do hand expression and offer drops of colostrum for now.   Mom need a manual pump on the floor to pre pump 5-10 minutes to extend nipple.   Maternal Data Has patient been taught Hand Expression?: Yes Does the patient have breastfeeding experience prior to this delivery?: No  Feeding Mother's Current Feeding Choice: Breast Milk  LATCH Score Latch: Repeated attempts needed to sustain latch, nipple held in mouth throughout feeding, stimulation needed to elicit sucking reflex.  Audible Swallowing: None  Type of Nipple: Flat  Comfort (Breast/Nipple): Soft / non-tender  Hold (Positioning): Assistance needed to correctly position infant at breast and maintain latch.  LATCH Score: 5   Lactation Tools Discussed/Used    Interventions Interventions: Breast feeding basics reviewed;Breast compression;Assisted with latch;Adjust position;Skin to skin;Support pillows;Breast massage;Position options;Hand express;Education;Pre-pump if needed (Mom to get a manual pump on the floor to pre pump 5-10 min before latching.)  Discharge Pump: Personal WIC Program: No  Consult Status Consult Status: Follow-up Date: 06/03/21 Follow-up type: In-patient    Sargent Mankey  Nicholson-Springer 06/02/2021, 5:28 PM

## 2021-06-02 NOTE — Lactation Note (Signed)
This note was copied from a baby's chart. Lactation Consultation Note  Patient Name: Colleen Diaz IEPPI'R Date: 06/02/2021 Reason for consult: Follow-up assessment;Mother's request;Difficult latch;Primapara;1st time breastfeeding;Term Age:29 hours  Mom has flat nipples. LC provided breast shells to wear not pumping, sleeping or nursing. Parts, assembly, usage, and cleaning reviewed.   Mom taught hand expression but only beads of colostrum noted. LC provided Mom with manual pump- pre pumped for 5-10 minutes. Colostrum drops noted and infant able to latch on the left breast with a tea cup hold with signs of milk transfer.   Plan 1. To feed based on cues 8-12x in 24 hr period no more than 3-4 hrs without an attempt. Mom to offer both breasts and look for signs of milk transfer.          2. Mom to use manual pump to pre pump prior to latching.          3. I and O sheet reviewed.          4. [place] brochure of inpatient and outpatient services reviewed.   All questions answered at the end of the visit.   Maternal Data Has patient been taught Hand Expression?: Yes Does the patient have breastfeeding experience prior to this delivery?: No  Feeding Mother's Current Feeding Choice: Breast Milk  LATCH Score Latch: Repeated attempts needed to sustain latch, nipple held in mouth throughout feeding, stimulation needed to elicit sucking reflex.  Audible Swallowing: A few with stimulation  Type of Nipple: Flat (flat but will erect with pre pumping using manual pump)  Comfort (Breast/Nipple): Soft / non-tender  Hold (Positioning): Assistance needed to correctly position infant at breast and maintain latch.  LATCH Score: 6   Lactation Tools Discussed/Used    Interventions Interventions: Breast feeding basics reviewed;Breast compression;Assisted with latch;Adjust position;Hand pump;Skin to skin;Support pillows;Breast massage;Position options;Hand express;Expressed milk;Education;Pre-pump  if needed;Shells  Discharge Pump: Personal WIC Program: No  Consult Status Consult Status: Follow-up Date: 06/03/21 Follow-up type: In-patient    Courtne Lighty  Nicholson-Springer 06/02/2021, 8:13 PM

## 2021-06-02 NOTE — Progress Notes (Signed)
Cytotec x 1 FHT  variable shaped decel to 80s for 1-2 min following Cytotec treated with IV fluids Now FHT cat one UCs q2-3 min Cx 1/80/-2/vtx  A/P: D/W patient>no more Cytotec         If UCs space out will start pitocin         D/W pain relief options>she will consider

## 2021-06-02 NOTE — H&P (Signed)
Colleen Diaz is a 30 y.o. female presenting for IOL due to hx of IUGR followed in [place] with MFM.  Transferred to [place] at 35 weeks.  MFM [place] at [facility] showed no IUGR but still wanted IOL at 39 weeks. Pregnancy otherwise uncomplicated. GBS-. OB History     Gravida  5   Para      Term      Preterm      AB  4   Living         SAB  2   IAB  2   Ectopic      Multiple      Live Births             Past Medical History:  Diagnosis Date   Allergic rhinitis    followed by Dr Fredderick Phenix   Anemia    Anxiety    Depression    Fibroadenoma of left breast 2012   Dx by ultrasound.    IBS (irritable bowel syndrome)    STD (sexually transmitted disease) 08/2011   Tx'd for Chlamydia   Vaginal Pap smear, abnormal    Past Surgical History:  Procedure Laterality Date   COLPOSCOPY     DILATION AND CURETTAGE OF UTERUS  09-04-13   TAB   Family History: family history includes Cancer in her paternal grandmother; Mental illness in her maternal grandmother and mother; Seizures in her maternal aunt; Thyroid disease in her father. Social History:  reports that she has never smoked. She has never used smokeless tobacco. She reports previous alcohol use of about 1.0 standard drink of alcohol per week. She reports previous drug use. Drug: Marijuana.     Maternal Diabetes: No Genetic Screening: Normal Maternal Ultrasounds/Referrals: Normal and IUGR But recent showed no IUGR Fetal Ultrasounds or other Referrals:  None Maternal Substance Abuse:  No Significant Maternal Medications:  None Significant Maternal Lab Results:  Group B Strep negative Other Comments:  None  Review of Systems History Dilation: 2 Effacement (%): 80 Station: -2 Exam by:: Dr. Corinna Capra Blood pressure 120/82, pulse 66, temperature 97.8 F (36.6 C), temperature source Oral, resp. rate 18, height 5\' 9"  (1.753 m), weight 80.6 kg, last menstrual period 08/31/2020. Exam Physical Exam  This patient has been seen and  examined.   All of her questions were answered.  Labs and vital signs reviewed.  Informed consent has been obtained.  The History and Physical is current.  Prenatal labs: ABO, Rh: --/--/O POS (06/16 0040) Antibody: NEG (06/16 0040) Rubella: Immune (05/24 0000) RPR: Nonreactive (05/24 0000)  HBsAg: Negative (05/24 0000)  HIV: Non-reactive (05/24 0000)  GBS: Negative/-- (05/30 0000)   Assessment/Plan: IUP at 39 weeks Hx of IUGR with recent normal [place] S/P cytotec.  Now AROM and anticipate SVD   Luz Lex 06/02/2021, 9:23 AM

## 2021-06-03 LAB — CBC
HCT: 32.6 % — ABNORMAL LOW (ref 36.0–46.0)
Hemoglobin: 10.5 g/dL — ABNORMAL LOW (ref 12.0–15.0)
MCH: 27.8 pg (ref 26.0–34.0)
MCHC: 32.2 g/dL (ref 30.0–36.0)
MCV: 86.2 fL (ref 80.0–100.0)
Platelets: 260 10*3/uL (ref 150–400)
RBC: 3.78 MIL/uL — ABNORMAL LOW (ref 3.87–5.11)
RDW: 13.4 % (ref 11.5–15.5)
WBC: 13.3 10*3/uL — ABNORMAL HIGH (ref 4.0–10.5)
nRBC: 0 % (ref 0.0–0.2)

## 2021-06-03 NOTE — Anesthesia Postprocedure Evaluation (Signed)
Anesthesia Post Note  Patient: Colleen Diaz  Procedure(s) Performed: AN AD HOC LABOR EPIDURAL     Patient location during evaluation: Mother Baby Anesthesia Type: Epidural Level of consciousness: awake Pain management: satisfactory to patient Vital Signs Assessment: post-procedure vital signs reviewed and stable Respiratory status: spontaneous breathing Cardiovascular status: stable Anesthetic complications: no   No notable events documented.  Last Vitals:  Vitals:   06/02/21 2350 06/03/21 0330  BP: 113/66 (!) 114/52  Pulse: 83 68  Resp: 16 16  Temp: 36.9 C 36.7 C  SpO2: 97% 99%    Last Pain:  Vitals:   06/03/21 1110  TempSrc:   PainSc: 0-No pain   Pain Goal:                   [employer]

## 2021-06-03 NOTE — Lactation Note (Signed)
This note was copied from a baby's chart. Lactation Consultation Note  Patient Name: Colleen Diaz [id number] Date: 06/03/2021   Age:29 hours  Mom was assisted with latch using the teacup hold while Mom was in side-lying position. "Tharon Aquas" would latch briefly, but was quick to fall sleep, which I explained to parents is normal for an infant during the 1st 24 hrs of life.   I fed DBM to infant with Enfamil extra-slow flow nipple, but it was too fast. I switched to Nfant Slow Flow nipple; it was better, but possible that infant would benefit from the Nfant Extra-Slow Flow nipple. Darrol Jump, NP notified of my observation & placed an order for an SLP feeding assessment. SLP notified. Parents were notified that an SLP would come this evening or tomorrow morning.  Mom to pump whenever infant receives a bottle. Size 21 flanges are appropriate for Mom, which I provided. Mom has a Spectra pump & a hands-free pump at home.   LC to f/u tomorrow before d/c. RN to provided bottle-feeding volume parameters to parents.    Matthias Hughs [facility] 06/03/2021, 2:06 PM

## 2021-06-03 NOTE — Social Work (Signed)
CSW received consult for hx of Anxiety.  CSW met with MOB to offer support and complete assessment.     CSW introduced self and role. CSW observed FOB sitting bedside and MOB holding infant 'Frankie.' CSW informed MOB declined to have FOB leave the room for assessment. CSW informed MOB of reason for consult. MOB was understanding and reported she was diagnosed with anxiety at age 30. MOB disclosed she experienced some anxiety during pregnancy and was going to a therapist, but eventually stopped. MOB stated at age 20, she was prescribed Zoloft, however she did not find it to be helpful. MOB reported she is currently feeling well and did not display any acute mental health symptoms. MOB identified a support system consisting of friends, family and FOB. MOB denies any current SI or HI.   CSW provided education regarding the baby blues period versus PPD and provided resources. CSW provided the New Mom Checklist and encouraged MOB to self evaluate and contact a medical professional if symptoms are noted at any time.   CSW provided review of Sudden Infant Death Syndrome (SIDS) precautions. MOB has all needs for infant. MOB denies any barriers to follow-up care. MOB stated she has no additional needs at this time.  CSW identifies no further need for intervention and no barriers to discharge at this time.  Chaney Johnson, LCSWA Clinical Social Work [facility] [phone]  

## 2021-06-03 NOTE — Progress Notes (Signed)
Postpartum Progress Note  Post Partum Day 1 s/p spontaneous vaginal delivery.  Patient reports well-controlled pain, ambulating without difficulty, voiding spontaneously, tolerating PO.  Vaginal bleeding is appropriate.   Objective: Blood pressure (!) 114/52, pulse 68, temperature 98.1 F (36.7 C), temperature source Oral, resp. rate 16, height 5\' 9"  (1.753 m), weight 80.6 kg, last menstrual period 08/31/2020, SpO2 99 %, unknown if currently breastfeeding.  Physical Exam:  General: alert and no distress Lochia: appropriate Uterine Fundus: firm DVT Evaluation: No evidence of DVT seen on physical exam.  Recent Labs    06/02/21 0039 06/03/21 0524  HGB 11.0* 10.5*  HCT 32.7* 32.6*    Assessment/Plan: Postpartum Day 1, s/p vaginal delivery. Continue routine postpartum care Lactation following Anticipate discharge home later this evening or tomorrow   LOS: 1 day   Colleen Diaz 06/03/2021, 7:19 AM

## 2021-06-04 MED ORDER — IBUPROFEN 600 MG PO TABS: 600.0000 mg | ORAL_TABLET | Freq: Four times a day (QID) | ORAL | 0 refills | Status: AC

## 2021-06-04 MED ORDER — ACETAMINOPHEN 325 MG PO TABS: 650.0000 mg | ORAL_TABLET | ORAL | 0 refills | Status: AC | PRN

## 2021-06-04 NOTE — Progress Notes (Signed)
Postpartum Progress Note  Post Partum Day 2 s/p spontaneous vaginal delivery.  Patient reports well-controlled pain, ambulating without difficulty, voiding spontaneously, tolerating PO.  Vaginal bleeding is appropriate.   Objective: Blood pressure 110/75, pulse 60, temperature 98.4 F (36.9 C), temperature source Oral, resp. rate 16, height 5\' 9"  (1.753 m), weight 80.6 kg, last menstrual period 08/31/2020, SpO2 99 %, unknown if currently breastfeeding.  Physical Exam:  General: alert and no distress, up and ambulating in room Lochia: appropriate Uterine Fundus: firm DVT Evaluation: No evidence of DVT seen on physical exam.  Recent Labs    06/02/21 0039 06/03/21 0524  HGB 11.0* 10.5*  HCT 32.7* 32.6*     Assessment/Plan: Postpartum Day 2, s/p vaginal delivery. Continue routine postpartum care Lactation following Anticipate discharge home today   LOS: 2 days   Carlyon Shadow 06/04/2021, 7:45 AM

## 2021-06-04 NOTE — Lactation Note (Addendum)
This note was copied from a baby's chart. Lactation Consultation Note  Patient Name: Colleen Diaz PRXYV'O Date: 06/04/2021 Reason for consult: Follow-up assessment Age:29 hours  P1, Baby has been sleepy at the breast and having difficulty sustaining latch.   Mother states her goal is to breastfeed.  Mother has been pumping q 3 hours with volume of 8 ml and supplementing with donor milk. SLP came in during consult to review which artificial nipple to use and observe feeding. Baby initially latched briefly 5 min, came off and was supplemented with 8 ml of mother's milk using purple nfant nipple. LC suggested using #20 nipple shield which was prefilled.   Instructed mother how to apply.   Provided pillows for support.  Encouraged breast compression.   Baby sustained latch with intermittent rhythmical sucking bursts and swallows.   Reviewed volume guidelines and feeding frequency. Encouraged OP lactation appt. Reviewed engorgement care and monitoring voids/stools. Provided mother with comfort gels for tender nipples.  FOB finished feeding with additional donor milk.    Feeding Mother's Current Feeding Choice: Breast Milk and Donor Milk Nipple Type: Nfant Slow Flow (purple)  LATCH Score Latch: Repeated attempts needed to sustain latch, nipple held in mouth throughout feeding, stimulation needed to elicit sucking reflex.  Audible Swallowing: A few with stimulation  Type of Nipple: Everted at rest and after stimulation  Comfort (Breast/Nipple): Soft / non-tender  Hold (Positioning): Assistance needed to correctly position infant at breast and maintain latch.  LATCH Score: 7   Lactation Tools Discussed/Used Tools: Shells;Pump Nipple shield size: 20 (prefilled) Pump Education: Setup, frequency, and cleaning Reason for Pumping: difficult latch/supplementation Pumping frequency: q 3 hours Pumped volume: 8 mL  Interventions Interventions: Breast feeding basics  reviewed;Assisted with latch;Education  Discharge Discharge Education: Engorgement and breast care;Warning signs for feeding baby;Outpatient recommendation Pump: DEBP  Consult Status Consult Status: Complete Date: 06/04/21    Vivianne Master [facility] 06/04/2021, 9:42 AM

## 2021-06-05 NOTE — Discharge Summary (Signed)
Obstetric Discharge Summary  Colleen Diaz is a 30 y.o. female that presented on 06/02/2021 for IOL at 23 weeks for possible IUGR (followed by MFM in [place]).  She was admitted to labor and delivery for induction.  Her labor course was uncomplicated and she delivered a viable female infant on 06/02/21.  Her postpartum course was uncomplicated and on PPD#2, she reported well controlled pain, spontaneous voiding, ambulating without difficulty, and tolerating PO.  She was stable for discharge home on 06/04/21 with plans for in-office follow up.  Hemoglobin  Date Value Ref Range Status  06/03/2021 10.5 (L) 12.0 - 15.0 g/dL Final  08/06/2017 11.5 11.1 - 15.9 g/dL Final   Hemoglobin, fingerstick  Date Value Ref Range Status  03/19/2015 11.7 (L) 12.0 - 16.0 g/dL Final   HCT  Date Value Ref Range Status  06/03/2021 32.6 (L) 36.0 - 46.0 % Final   Hematocrit  Date Value Ref Range Status  08/06/2017 35.5 34.0 - 46.6 % Final    Physical Exam:  General: alert and no distress Lochia: appropriate Uterine Fundus: firm DVT Evaluation: No evidence of DVT seen on physical exam.  Discharge Diagnoses: Term Pregnancy-delivered  Discharge Information: Date: 06/05/2021 Activity: Pelvic rest, as tolerated Diet: routine Medications: Tylenol, motrin Condition: stable Instructions: Refer to practice specific booklet.  Discussed prior to discharge.  Discharge to: [place] Follow up.   Why: Please follow up for 6 week postpartum visit. Contact information: [address] [place] [id number] [phone]                 Newborn Data: Live born female  Birth Weight: 6 lb 12.3 oz (3070 g) APGAR: 15, 9  Newborn Delivery   Birth date/time: 06/02/2021 16:37:00 Delivery type: Vaginal, Spontaneous      Home with mother.  Carlyon Shadow 06/05/2021, 9:11 AM

## 2022-07-31 IMAGING — US US MFM OB DETAIL+14 WK
1 series · 12 of 28 positions shown · non-contrast
Comparison: none

[Series 1: us mfm ob detail+14 wk · 151 acquisitions, 12 frames shown]
[im 6/151]
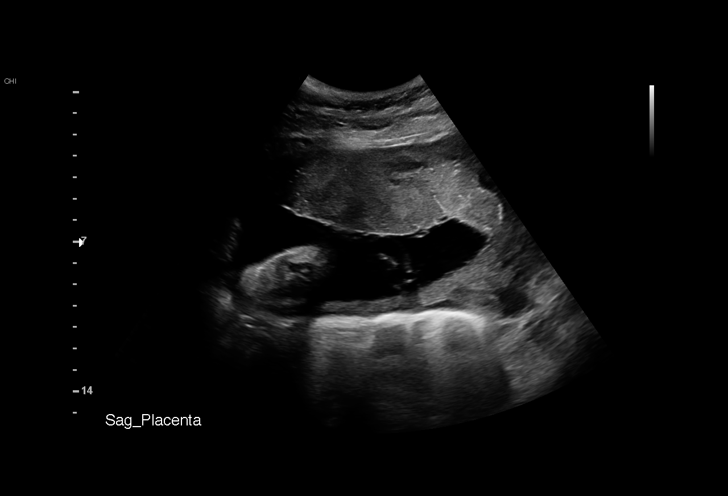
[im 17/151]
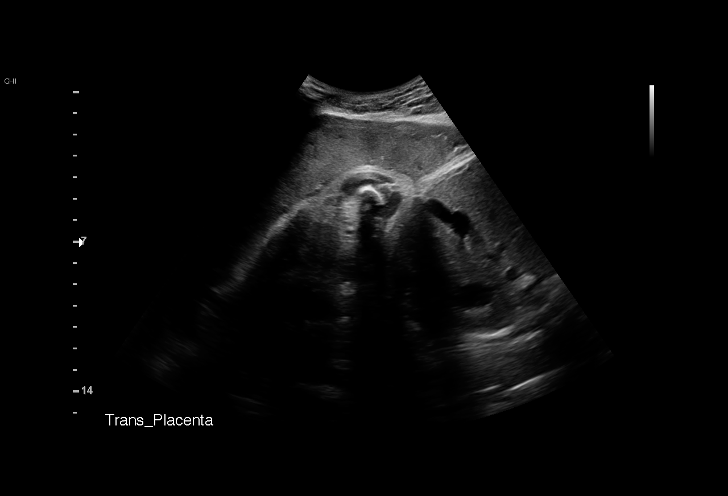
[im 28/151]
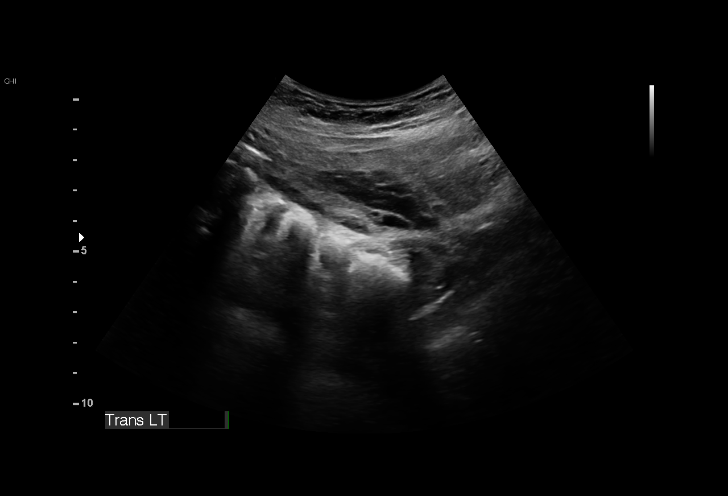
[im 45/151]
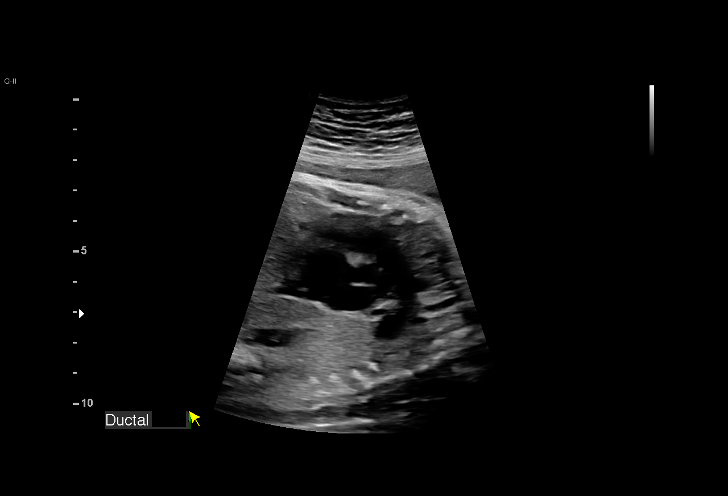
[im 56/151]
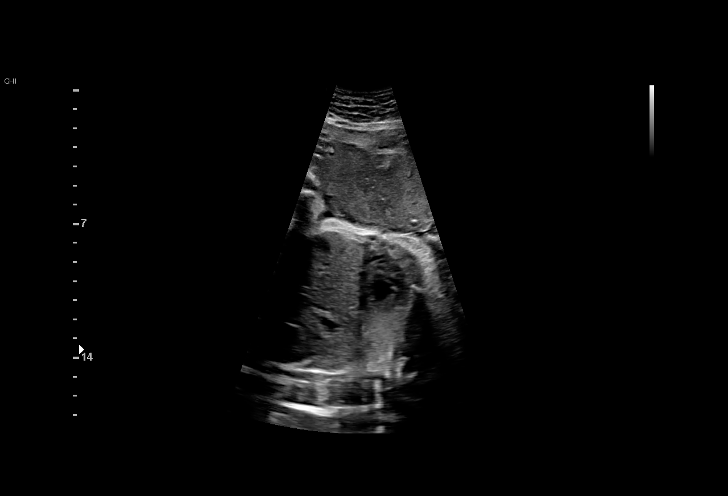
[im 67/151]
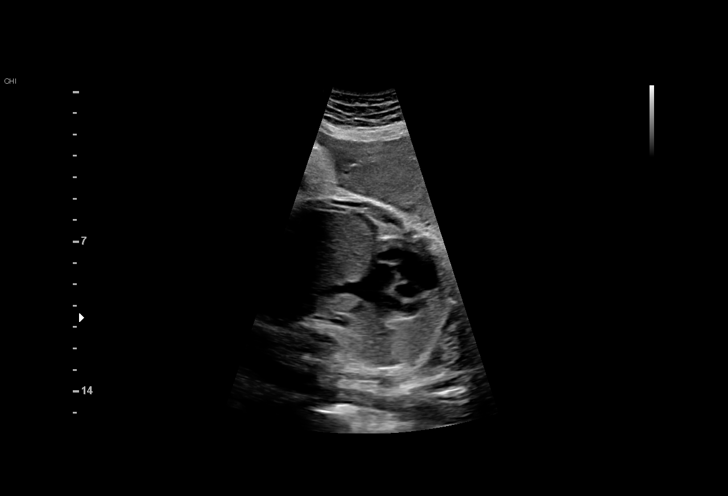
[im 84/151]
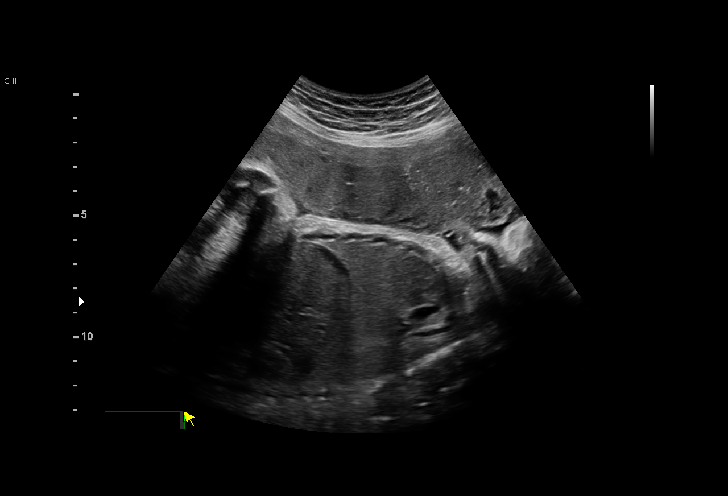
[im 95/151]
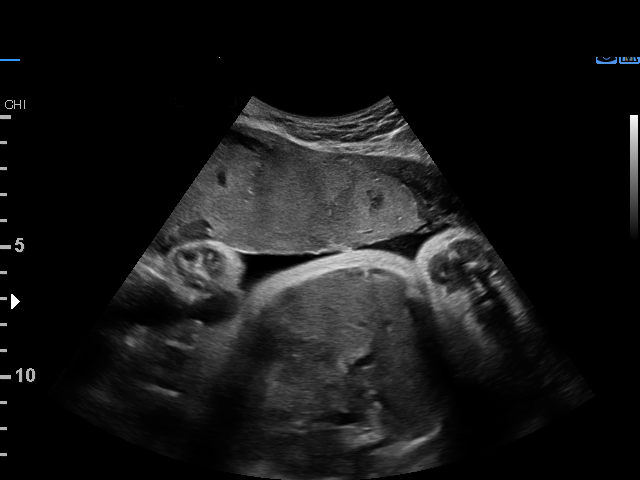
[im 106/151]
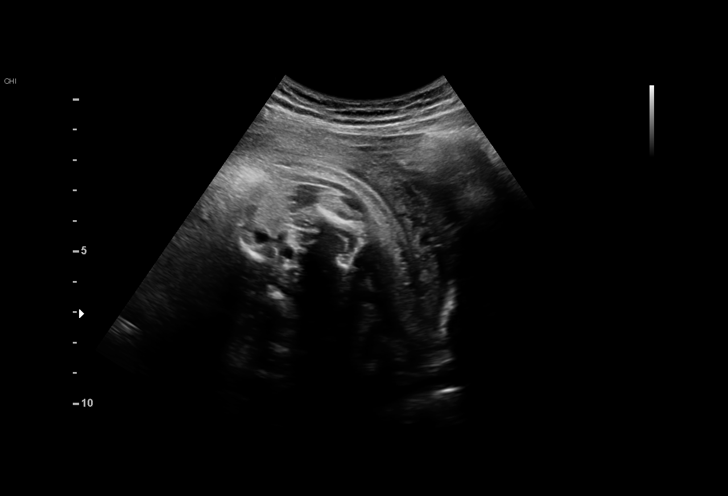
[im 123/151]
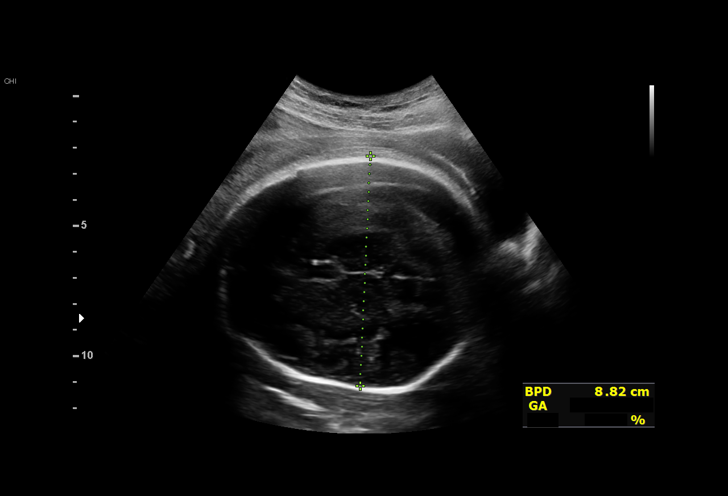
[im 134/151]
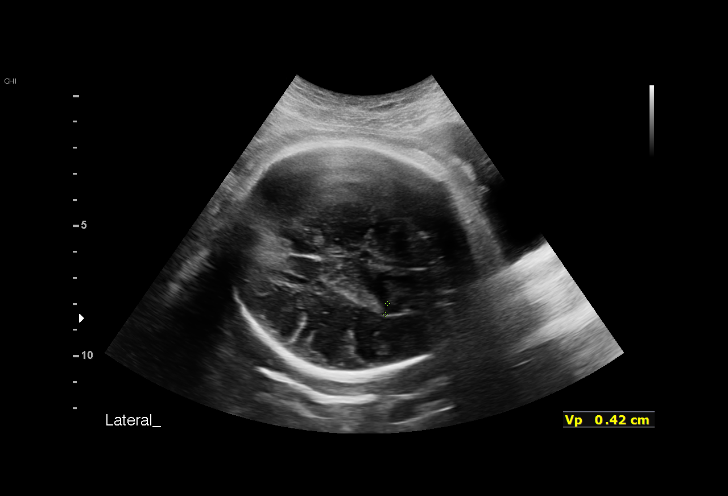
[im 145/151]
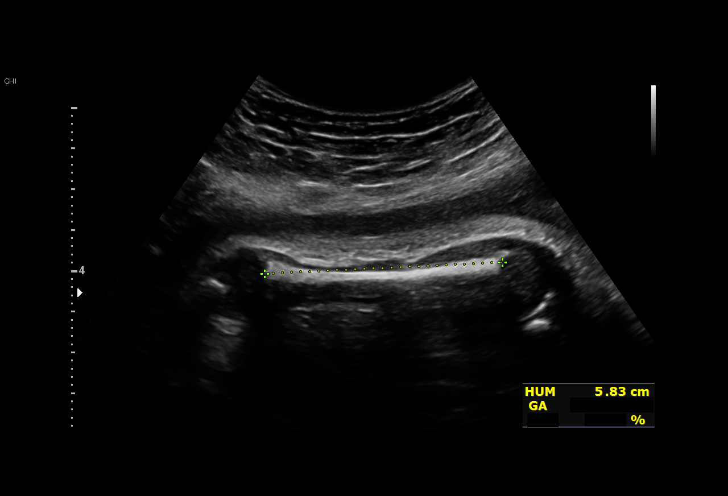

[12 of 28 positions shown; findings below may reference images not displayed]

[REDACTED]

 3  US MFM UA CORD DOPPLER                76820.02    IRAIDA CHARLOT

Indications

 Maternal care for known or suspected poor
 fetal growth, third trimester, not applicable or
 unspecified IUGR
 LR NIPS female  according to [REDACTED] weeks gestation of pregnancy
Fetal Evaluation

 Num Of Fetuses:         1
 Fetal Heart Rate(bpm):  152
 Cardiac Activity:       Observed
 Presentation:           Cephalic
 Placenta:               Anterior
 P. Cord Insertion:      Visualized, central

 Amniotic Fluid
 AFI FV:      Within normal limits

 AFI Sum(cm)     %Tile       Largest Pocket(cm)
 12.07           38

 RUQ(cm)       RLQ(cm)       LUQ(cm)        LLQ(cm)

Biophysical Evaluation

 Amniotic F.V:   Pocket => 2 cm             F. Tone:        Observed
 F. Movement:    Observed                   Score:          [DATE]
 F. Breathing:   Observed
Biometry

 BPD:      87.7  mm     G. Age:  35w 3d         35  %    CI:        74.79   %    70 - 86
                                                         FL/HC:      21.1   %    20.1 -
 HC:      321.8  mm     G. Age:  36w 2d         22  %    HC/AC:      1.02        0.93 -
 AC:      314.4  mm     G. Age:  35w 3d         34  %    FL/BPD:     77.5   %    71 - 87
 FL:         68  mm     G. Age:  35w 0d         15  %    FL/AC:      21.6   %    20 - 24
 HUM:        59  mm     G. Age:  34w 1d         28  %

 LV:        4.2  mm

 Est. FW:    9267  gm    5 lb 14 oz      28  %
OB History

 Gravidity:    5          SAB:   2
 TOP:          2        Living:  0
Gestational Age

 Clinical EDD:  36w 2d                                        EDD:   06/07/21
 U/S Today:     35w 4d                                        EDD:   06/12/21
 Best:          36w 2d     Det. By:  Clinical EDD             EDD:   06/07/21
Anatomy

 Cranium:               Appears normal         LVOT:                   Appears normal
 Cavum:                 Appears normal         Aortic Arch:            Appears normal
 Ventricles:            Appears normal         Ductal Arch:            Appears normal
 Choroid Plexus:        Not well visualized    Diaphragm:              Appears normal
 Cerebellum:            Appears normal         Stomach:                Appears normal, left
                                                                       sided
 Posterior Fossa:       Not well visualized    Abdomen:                Appears normal
 Nuchal Fold:           Not applicable (>20    Abdominal Wall:         Not well visualized
                        wks GA)
 Face:                  Orbits appear          Cord Vessels:           Appears normal (3
                        normal                                         vessel cord)
 Lips:                  Appears normal         Kidneys:                Appear normal
 Palate:                Not well visualized    Bladder:                Appears normal
 Thoracic:              Appears normal         Spine:                  Not well visualized
 Heart:                 Appears normal         Upper Extremities:      Not well visualized
                        (4CH, axis, and
                        situs)
 RVOT:                  Appears normal         Lower Extremities:      Not well visualized

 Other:  Fetus appears to be female. Technically difficult due to advanced GA
         and fetal position.
Doppler - Fetal Vessels

 Umbilical Artery
  S/D     %tile      RI    %tile                             ADFV    RDFV
  3.11       87    0.68       90                                No      No
Cervix Uterus Adnexa

 Cervix
 Not visualized (advanced GA >53wks)

 Uterus
 No abnormality visualized.

 Right Ovary
 Within normal limits.

 Left Ovary
 Within normal limits.

 Cul De Sac
 No free fluid seen.

 Adnexa
 No abnormality visualized.
Impression

 On today's ultrasound, fetal growth is appropriate for
 gestational age.  The estimated fetal weight is at the 28th
 percentile (Hadlock formula).  Abdominal circumference
 measurement is at the 34th percentile.  Amniotic fluid is
 normal and good fetal activity seen.

 Antenatal testing is reassuring.  BPP [DATE].  Umbilical artery
 Doppler showed normal forward diastolic flow.
 Fetal anatomical survey appears normal but limited by
 advanced gestational age.  Cephalic presentation.
 [id number]

 I had the [facility] of [facility] She is G5 P0 at 36w 2d gestation
 and is here for ultrasound and consultation. Fetal growth
 restriction was diagnosed on ultrasound performed in [place]
 [place], [HOSPITAL].

 Patient recently moved from [place] and will be delivering
 in [HOSPITAL]. Her prenatal course was uneventful.  On cell
 free fetal DNA screening, the risks of fetal aneuploidies are
 not increased.  MSAFP screening showed low risk for open
 neural tube defects.  She does not have gestational diabetes.
 Her blood pressures have been normal at prenatal visits.

 On ultrasound performed on 04/28/2021, the estimated fetal
 weight was 4 pounds 14 ounces and at the 25th percentile.
 Abdominal circumference measurement was at the 7th
 percentile.  3 weeks before that ultrasound the abdominal
 circumference measurement was at the 5th percentile and
 the estimated fetal weight was at the 12th percentile.
 Midtrimester fetal anatomical survey was reportedly normal.

 Past medical history: No history of diabetes or hypertension
 or thyroid disorders.
 Past surgical history: Nil of note.
 Medications: Prenatal vitamins, iron supplements.
 Allergies: Oxycodone and doxycycline.
 Social history: Denies tobacco or drug or alcohol use.  She is
 single and her fianc? is in the military.  She is an accountant
 and works from home.
 Family history: Both parents are in good health.  No history of
 venous thromboembolism in the family.
 GYN history: Abnormal Pap smears in September 2020 and
 colposcopy was performed.  No history of LEEP.  No history
 of breast disease.

 Suspected fetal growth restriction
 I reassured the patient of the findings that are NOT consistent
 with fetal growth restriction.  I explained possible discrepancy
 in estimating fetal weights at different institutions using
 different formula.
 Antenatal testing is reassuring.
 I reassured her that the likelihood of having a normal
 outcome in this pregnancy is very high.
 restriction.

 We briefly discussed timing of delivery.  It is reasonable to
 consider induction of labor at 39 weeks gestation provided
 cervix is favorable.
Recommendations

 -Weekly BPP or NST at your office.
 -Consider delivery at 39 weeks gestation.
                 [place], [place]rum
